# Patient Record
Sex: Female | Born: 1950
Health system: Southern US, Community
[De-identification: ages and names within clinical notes are randomized; demographics above are authoritative.]

## PROBLEM LIST (undated history)

## (undated) DIAGNOSIS — E785 Hyperlipidemia, unspecified: Secondary | ICD-10-CM

## (undated) HISTORY — DX: Hyperlipidemia, unspecified: E78.5

---

## 2001-09-13 ENCOUNTER — Other Ambulatory Visit: Admission: RE | Admit: 2001-09-13 | Discharge: 2001-09-13 | Payer: Self-pay | Admitting: Obstetrics and Gynecology

## 2002-06-04 ENCOUNTER — Encounter: Admission: RE | Admit: 2002-06-04 | Discharge: 2002-06-04 | Payer: Self-pay | Admitting: Obstetrics and Gynecology

## 2002-06-04 ENCOUNTER — Encounter: Payer: Self-pay | Admitting: Obstetrics and Gynecology

## 2002-12-25 ENCOUNTER — Other Ambulatory Visit: Admission: RE | Admit: 2002-12-25 | Discharge: 2002-12-25 | Payer: Self-pay | Admitting: Obstetrics and Gynecology

## 2004-03-17 ENCOUNTER — Other Ambulatory Visit: Admission: RE | Admit: 2004-03-17 | Discharge: 2004-03-17 | Payer: Self-pay | Admitting: Obstetrics and Gynecology

## 2004-07-23 ENCOUNTER — Ambulatory Visit (HOSPITAL_COMMUNITY): Admission: RE | Admit: 2004-07-23 | Discharge: 2004-07-23 | Payer: Self-pay | Admitting: Gastroenterology

## 2005-03-24 ENCOUNTER — Other Ambulatory Visit: Admission: RE | Admit: 2005-03-24 | Discharge: 2005-03-24 | Payer: Self-pay | Admitting: Obstetrics and Gynecology

## 2006-04-14 ENCOUNTER — Other Ambulatory Visit: Admission: RE | Admit: 2006-04-14 | Discharge: 2006-04-14 | Payer: Self-pay | Admitting: Obstetrics and Gynecology

## 2006-09-18 ENCOUNTER — Encounter: Admission: RE | Admit: 2006-09-18 | Discharge: 2006-09-18 | Payer: Self-pay | Admitting: Obstetrics and Gynecology

## 2007-05-02 ENCOUNTER — Encounter: Admission: RE | Admit: 2007-05-02 | Discharge: 2007-05-02 | Payer: Self-pay | Admitting: Obstetrics and Gynecology

## 2008-05-06 ENCOUNTER — Encounter: Admission: RE | Admit: 2008-05-06 | Discharge: 2008-05-06 | Payer: Self-pay | Admitting: Obstetrics and Gynecology

## 2009-06-03 ENCOUNTER — Encounter: Admission: RE | Admit: 2009-06-03 | Discharge: 2009-06-03 | Payer: Self-pay | Admitting: Obstetrics and Gynecology

## 2010-06-04 ENCOUNTER — Encounter: Admission: RE | Admit: 2010-06-04 | Discharge: 2010-06-04 | Payer: Self-pay | Admitting: Obstetrics and Gynecology

## 2011-03-11 NOTE — Op Note (Signed)
NAMECHASITTY, HEHL              ACCOUNT NO.:  [id number]   MEDICAL RECORD NO.:  [id number]          PATIENT TYPE:  AMB   LOCATION:  ENDO                         FACILITY:  [facility]   PHYSICIAN:  Anselmo Rod, M.D.  DATE OF BIRTH:  1950/11/28   DATE OF PROCEDURE:  07/23/2004  DATE OF DISCHARGE:                                 OPERATIVE REPORT   PROCEDURE PERFORMED:  Screening colonoscopy.   ENDOSCOPIST:  Anselmo Rod, M.D.   INSTRUMENT USED:  Olympus video colonoscope.   INDICATION FOR PROCEDURE:  A 60 year old white female undergoing screening  colonoscopy.  The patient has a history of occasional BRB.  Rule out colonic  polyps, masses, etc.   PREPROCEDURE PREPARATION:  Informed consent was procured from the patient.  The patient fasted for eight hours prior to the procedure and prepped with a  bottle of magnesium citrate and a gallon of GoLYTELY the night prior to the  procedure.   PREPROCEDURE PHYSICAL EXAMINATION:  VITAL SIGNS:  Stable.  NECK:  Supple.  CHEST:  Clear to auscultation.  HEART:  S1 and S2 regular.  ABDOMEN:  Soft with normal bowel sounds.   DESCRIPTION OF PROCEDURE:  The patient was placed in the left lateral  decubitus position.  Sedated with 75 mg of Demerol and 7.5 mg of Versed in  slow incremental doses. Once the patient was adequately sedated and  maintained on low-flow oxygen and continuous cardiac monitoring, the Olympus  video colonoscope was advanced from the rectum to the cecum.  The  appendiceal orifice and ileocecal valve were clearly visualized and  photographed.  No masses, polyps, erosions, ulcerations or diverticula were  seen.  Small internal hemorrhoids were appreciated on retroflexion in the  rectum.  The patient tolerated the procedure well without immediate  complications.   IMPRESSION:  1.  Normal colonoscopy up to the cecum, except for small nonbleeding      internal hemorrhoids.  2.  No masses, polys or diverticula seen.   RECOMMENDATIONS:  1.  Continue on a high fiber diet with liberal fluid intake.  2.  Repeat colonoscopy in the next 10 years unless the patient develops any      abnormal symptoms in the interim.  3.  Outpatient followup if needs arises in the future.       JNM/MEDQ  D:  07/23/2004  T:  07/23/2004  Job:  [id number]   cc:   Dois Davenport A. Rivard, M.D.  [address]  [place]  [place] [address]  Fax: [phone]

## 2011-05-16 ENCOUNTER — Other Ambulatory Visit: Payer: Self-pay | Admitting: Obstetrics and Gynecology

## 2011-05-16 DIAGNOSIS — Z1231 Encounter for screening mammogram for malignant neoplasm of breast: Secondary | ICD-10-CM

## 2011-06-09 ENCOUNTER — Ambulatory Visit
Admission: RE | Admit: 2011-06-09 | Discharge: 2011-06-09 | Disposition: A | Discharge status: Home / Self Care | Payer: BC Managed Care – PPO | Source: Ambulatory Visit | Attending: Obstetrics and Gynecology | Admitting: Obstetrics and Gynecology

## 2011-06-09 DIAGNOSIS — Z1231 Encounter for screening mammogram for malignant neoplasm of breast: Secondary | ICD-10-CM

## 2012-05-09 ENCOUNTER — Telehealth: Payer: Self-pay | Admitting: Obstetrics and Gynecology

## 2012-05-09 NOTE — Telephone Encounter (Signed)
LAURA/SR PT seen in hospital

## 2012-05-10 ENCOUNTER — Other Ambulatory Visit: Payer: Self-pay | Admitting: Obstetrics and Gynecology

## 2012-05-10 DIAGNOSIS — Z1231 Encounter for screening mammogram for malignant neoplasm of breast: Secondary | ICD-10-CM

## 2012-05-10 NOTE — Telephone Encounter (Signed)
Pt just curious as to if we had started doing mmg here in the office.  ld

## 2012-05-10 NOTE — Telephone Encounter (Signed)
LMTC @ 8:50  ld

## 2012-07-03 ENCOUNTER — Ambulatory Visit: Payer: BC Managed Care – PPO

## 2012-07-04 ENCOUNTER — Ambulatory Visit
Admission: RE | Admit: 2012-07-04 | Discharge: 2012-07-04 | Disposition: A | Discharge status: Home / Self Care | Payer: BC Managed Care – PPO | Source: Ambulatory Visit | Attending: Obstetrics and Gynecology | Admitting: Obstetrics and Gynecology

## 2012-07-04 DIAGNOSIS — Z1231 Encounter for screening mammogram for malignant neoplasm of breast: Secondary | ICD-10-CM

## 2012-07-05 ENCOUNTER — Encounter: Payer: Self-pay | Admitting: Obstetrics and Gynecology

## 2012-07-05 NOTE — Progress Notes (Signed)
Quick Note:  Please send "Dense breast" letter to patient and document in chart when letter is sent. ______ 

## 2012-08-29 ENCOUNTER — Ambulatory Visit: Payer: Self-pay | Admitting: Obstetrics and Gynecology

## 2012-08-30 ENCOUNTER — Ambulatory Visit: Payer: Self-pay | Admitting: Obstetrics and Gynecology

## 2013-08-28 ENCOUNTER — Other Ambulatory Visit: Payer: Self-pay

## 2013-08-28 DIAGNOSIS — Z1231 Encounter for screening mammogram for malignant neoplasm of breast: Secondary | ICD-10-CM

## 2013-10-02 ENCOUNTER — Ambulatory Visit: Payer: BC Managed Care – PPO

## 2013-11-19 ENCOUNTER — Ambulatory Visit: Payer: BC Managed Care – PPO

## 2013-11-20 ENCOUNTER — Ambulatory Visit
Admission: RE | Admit: 2013-11-20 | Discharge: 2013-11-20 | Disposition: A | Discharge status: Home / Self Care | Payer: BC Managed Care – PPO | Source: Ambulatory Visit

## 2013-11-20 ENCOUNTER — Ambulatory Visit: Payer: BC Managed Care – PPO

## 2013-11-20 DIAGNOSIS — Z1231 Encounter for screening mammogram for malignant neoplasm of breast: Secondary | ICD-10-CM

## 2014-11-27 ENCOUNTER — Other Ambulatory Visit: Payer: Self-pay

## 2014-11-27 DIAGNOSIS — Z1231 Encounter for screening mammogram for malignant neoplasm of breast: Secondary | ICD-10-CM

## 2014-12-18 ENCOUNTER — Ambulatory Visit
Admission: RE | Admit: 2014-12-18 | Discharge: 2014-12-18 | Disposition: A | Discharge status: Home / Self Care | Payer: BLUE CROSS/BLUE SHIELD | Source: Ambulatory Visit

## 2014-12-18 DIAGNOSIS — Z1231 Encounter for screening mammogram for malignant neoplasm of breast: Secondary | ICD-10-CM

## 2015-07-08 ENCOUNTER — Other Ambulatory Visit: Payer: Self-pay | Admitting: *Deleted

## 2015-11-16 ENCOUNTER — Other Ambulatory Visit: Payer: Self-pay

## 2015-11-16 DIAGNOSIS — Z1231 Encounter for screening mammogram for malignant neoplasm of breast: Secondary | ICD-10-CM

## 2015-12-25 ENCOUNTER — Ambulatory Visit: Payer: BLUE CROSS/BLUE SHIELD

## 2016-01-04 ENCOUNTER — Ambulatory Visit
Admission: RE | Admit: 2016-01-04 | Discharge: 2016-01-04 | Disposition: A | Discharge status: Home / Self Care | Payer: BLUE CROSS/BLUE SHIELD | Source: Ambulatory Visit

## 2016-01-04 DIAGNOSIS — Z1231 Encounter for screening mammogram for malignant neoplasm of breast: Secondary | ICD-10-CM

## 2016-07-14 DIAGNOSIS — H25013 Cortical age-related cataract, bilateral: Secondary | ICD-10-CM | POA: Diagnosis not present

## 2016-07-14 DIAGNOSIS — H40013 Open angle with borderline findings, low risk, bilateral: Secondary | ICD-10-CM | POA: Diagnosis not present

## 2016-07-14 DIAGNOSIS — H2513 Age-related nuclear cataract, bilateral: Secondary | ICD-10-CM | POA: Diagnosis not present

## 2016-07-14 DIAGNOSIS — H25042 Posterior subcapsular polar age-related cataract, left eye: Secondary | ICD-10-CM | POA: Diagnosis not present

## 2016-09-07 DIAGNOSIS — F4323 Adjustment disorder with mixed anxiety and depressed mood: Secondary | ICD-10-CM | POA: Diagnosis not present

## 2016-12-01 ENCOUNTER — Other Ambulatory Visit: Payer: Self-pay | Admitting: Obstetrics and Gynecology

## 2016-12-01 DIAGNOSIS — Z1231 Encounter for screening mammogram for malignant neoplasm of breast: Secondary | ICD-10-CM

## 2017-01-05 DIAGNOSIS — L821 Other seborrheic keratosis: Secondary | ICD-10-CM | POA: Diagnosis not present

## 2017-01-05 DIAGNOSIS — D225 Melanocytic nevi of trunk: Secondary | ICD-10-CM | POA: Diagnosis not present

## 2017-01-16 ENCOUNTER — Ambulatory Visit
Admission: RE | Admit: 2017-01-16 | Discharge: 2017-01-16 | Disposition: A | Discharge status: Home / Self Care | Payer: Medicare Other | Source: Ambulatory Visit | Attending: Obstetrics and Gynecology | Admitting: Obstetrics and Gynecology

## 2017-01-16 DIAGNOSIS — Z1231 Encounter for screening mammogram for malignant neoplasm of breast: Secondary | ICD-10-CM

## 2017-01-31 DIAGNOSIS — H903 Sensorineural hearing loss, bilateral: Secondary | ICD-10-CM | POA: Diagnosis not present

## 2017-02-02 DIAGNOSIS — F4323 Adjustment disorder with mixed anxiety and depressed mood: Secondary | ICD-10-CM | POA: Diagnosis not present

## 2017-03-03 DIAGNOSIS — F4323 Adjustment disorder with mixed anxiety and depressed mood: Secondary | ICD-10-CM | POA: Diagnosis not present

## 2017-05-17 DIAGNOSIS — Z1231 Encounter for screening mammogram for malignant neoplasm of breast: Secondary | ICD-10-CM | POA: Diagnosis not present

## 2017-05-17 DIAGNOSIS — M810 Age-related osteoporosis without current pathological fracture: Secondary | ICD-10-CM | POA: Diagnosis not present

## 2017-05-17 DIAGNOSIS — Z1211 Encounter for screening for malignant neoplasm of colon: Secondary | ICD-10-CM | POA: Diagnosis not present

## 2017-05-17 DIAGNOSIS — Z01419 Encounter for gynecological examination (general) (routine) without abnormal findings: Secondary | ICD-10-CM | POA: Diagnosis not present

## 2017-05-17 DIAGNOSIS — Z6820 Body mass index (BMI) 20.0-20.9, adult: Secondary | ICD-10-CM | POA: Diagnosis not present

## 2017-05-19 DIAGNOSIS — F4323 Adjustment disorder with mixed anxiety and depressed mood: Secondary | ICD-10-CM | POA: Diagnosis not present

## 2017-07-26 DIAGNOSIS — F4323 Adjustment disorder with mixed anxiety and depressed mood: Secondary | ICD-10-CM | POA: Diagnosis not present

## 2017-08-08 DIAGNOSIS — E559 Vitamin D deficiency, unspecified: Secondary | ICD-10-CM | POA: Diagnosis not present

## 2017-08-08 DIAGNOSIS — M810 Age-related osteoporosis without current pathological fracture: Secondary | ICD-10-CM | POA: Diagnosis not present

## 2017-08-08 DIAGNOSIS — Z0000 Encounter for general adult medical examination without abnormal findings: Secondary | ICD-10-CM | POA: Diagnosis not present

## 2017-08-08 DIAGNOSIS — Z801 Family history of malignant neoplasm of trachea, bronchus and lung: Secondary | ICD-10-CM | POA: Diagnosis not present

## 2017-08-08 DIAGNOSIS — Z1389 Encounter for screening for other disorder: Secondary | ICD-10-CM | POA: Diagnosis not present

## 2017-08-08 DIAGNOSIS — E7800 Pure hypercholesterolemia, unspecified: Secondary | ICD-10-CM | POA: Diagnosis not present

## 2017-08-17 DIAGNOSIS — F4323 Adjustment disorder with mixed anxiety and depressed mood: Secondary | ICD-10-CM | POA: Diagnosis not present

## 2017-10-06 DIAGNOSIS — E7800 Pure hypercholesterolemia, unspecified: Secondary | ICD-10-CM | POA: Diagnosis not present

## 2017-10-06 DIAGNOSIS — R740 Nonspecific elevation of levels of transaminase and lactic acid dehydrogenase [LDH]: Secondary | ICD-10-CM | POA: Diagnosis not present

## 2017-10-06 DIAGNOSIS — R350 Frequency of micturition: Secondary | ICD-10-CM | POA: Diagnosis not present

## 2017-12-11 ENCOUNTER — Other Ambulatory Visit: Payer: Self-pay | Admitting: Obstetrics and Gynecology

## 2017-12-11 DIAGNOSIS — Z1231 Encounter for screening mammogram for malignant neoplasm of breast: Secondary | ICD-10-CM

## 2017-12-27 DIAGNOSIS — H35361 Drusen (degenerative) of macula, right eye: Secondary | ICD-10-CM | POA: Diagnosis not present

## 2017-12-27 DIAGNOSIS — H43393 Other vitreous opacities, bilateral: Secondary | ICD-10-CM | POA: Diagnosis not present

## 2017-12-27 DIAGNOSIS — H40013 Open angle with borderline findings, low risk, bilateral: Secondary | ICD-10-CM | POA: Diagnosis not present

## 2017-12-27 DIAGNOSIS — H2513 Age-related nuclear cataract, bilateral: Secondary | ICD-10-CM | POA: Diagnosis not present

## 2018-01-18 ENCOUNTER — Ambulatory Visit: Payer: Medicare Other

## 2018-02-02 DIAGNOSIS — B351 Tinea unguium: Secondary | ICD-10-CM | POA: Diagnosis not present

## 2018-02-06 ENCOUNTER — Ambulatory Visit
Admission: RE | Admit: 2018-02-06 | Discharge: 2018-02-06 | Disposition: A | Discharge status: Home / Self Care | Payer: Medicare Other | Source: Ambulatory Visit | Attending: Obstetrics and Gynecology | Admitting: Obstetrics and Gynecology

## 2018-02-06 DIAGNOSIS — Z1231 Encounter for screening mammogram for malignant neoplasm of breast: Secondary | ICD-10-CM

## 2018-02-13 DIAGNOSIS — M2042 Other hammer toe(s) (acquired), left foot: Secondary | ICD-10-CM | POA: Diagnosis not present

## 2018-02-13 DIAGNOSIS — L602 Onychogryphosis: Secondary | ICD-10-CM | POA: Diagnosis not present

## 2018-02-13 DIAGNOSIS — M2041 Other hammer toe(s) (acquired), right foot: Secondary | ICD-10-CM | POA: Diagnosis not present

## 2018-02-13 DIAGNOSIS — D2371 Other benign neoplasm of skin of right lower limb, including hip: Secondary | ICD-10-CM | POA: Diagnosis not present

## 2018-02-13 DIAGNOSIS — L603 Nail dystrophy: Secondary | ICD-10-CM | POA: Diagnosis not present

## 2018-02-19 DIAGNOSIS — L603 Nail dystrophy: Secondary | ICD-10-CM | POA: Diagnosis not present

## 2018-02-28 ENCOUNTER — Ambulatory Visit: Payer: Medicare Other

## 2018-03-08 DIAGNOSIS — M2041 Other hammer toe(s) (acquired), right foot: Secondary | ICD-10-CM | POA: Diagnosis not present

## 2018-03-08 DIAGNOSIS — L602 Onychogryphosis: Secondary | ICD-10-CM | POA: Diagnosis not present

## 2018-03-08 DIAGNOSIS — M2042 Other hammer toe(s) (acquired), left foot: Secondary | ICD-10-CM | POA: Diagnosis not present

## 2018-03-08 DIAGNOSIS — D2371 Other benign neoplasm of skin of right lower limb, including hip: Secondary | ICD-10-CM | POA: Diagnosis not present

## 2018-03-27 DIAGNOSIS — D2371 Other benign neoplasm of skin of right lower limb, including hip: Secondary | ICD-10-CM | POA: Diagnosis not present

## 2018-04-11 DIAGNOSIS — D2371 Other benign neoplasm of skin of right lower limb, including hip: Secondary | ICD-10-CM | POA: Diagnosis not present

## 2018-05-14 DIAGNOSIS — D2371 Other benign neoplasm of skin of right lower limb, including hip: Secondary | ICD-10-CM | POA: Diagnosis not present

## 2018-06-14 DIAGNOSIS — M810 Age-related osteoporosis without current pathological fracture: Secondary | ICD-10-CM | POA: Diagnosis not present

## 2018-06-14 DIAGNOSIS — Z6820 Body mass index (BMI) 20.0-20.9, adult: Secondary | ICD-10-CM | POA: Diagnosis not present

## 2018-06-14 DIAGNOSIS — Z1231 Encounter for screening mammogram for malignant neoplasm of breast: Secondary | ICD-10-CM | POA: Diagnosis not present

## 2018-06-14 DIAGNOSIS — Z1211 Encounter for screening for malignant neoplasm of colon: Secondary | ICD-10-CM | POA: Diagnosis not present

## 2018-08-07 DIAGNOSIS — Z23 Encounter for immunization: Secondary | ICD-10-CM | POA: Diagnosis not present

## 2018-08-16 DIAGNOSIS — L298 Other pruritus: Secondary | ICD-10-CM | POA: Diagnosis not present

## 2018-08-21 DIAGNOSIS — M810 Age-related osteoporosis without current pathological fracture: Secondary | ICD-10-CM | POA: Diagnosis not present

## 2018-08-21 DIAGNOSIS — E7800 Pure hypercholesterolemia, unspecified: Secondary | ICD-10-CM | POA: Diagnosis not present

## 2018-08-21 DIAGNOSIS — Z0000 Encounter for general adult medical examination without abnormal findings: Secondary | ICD-10-CM | POA: Diagnosis not present

## 2018-08-21 DIAGNOSIS — D229 Melanocytic nevi, unspecified: Secondary | ICD-10-CM | POA: Diagnosis not present

## 2018-08-21 DIAGNOSIS — E559 Vitamin D deficiency, unspecified: Secondary | ICD-10-CM | POA: Diagnosis not present

## 2018-08-21 DIAGNOSIS — L821 Other seborrheic keratosis: Secondary | ICD-10-CM | POA: Diagnosis not present

## 2018-08-21 DIAGNOSIS — Z1389 Encounter for screening for other disorder: Secondary | ICD-10-CM | POA: Diagnosis not present

## 2018-10-31 DIAGNOSIS — M25561 Pain in right knee: Secondary | ICD-10-CM | POA: Diagnosis not present

## 2018-12-07 DIAGNOSIS — D2261 Melanocytic nevi of right upper limb, including shoulder: Secondary | ICD-10-CM | POA: Diagnosis not present

## 2018-12-07 DIAGNOSIS — D225 Melanocytic nevi of trunk: Secondary | ICD-10-CM | POA: Diagnosis not present

## 2018-12-07 DIAGNOSIS — L821 Other seborrheic keratosis: Secondary | ICD-10-CM | POA: Diagnosis not present

## 2018-12-07 DIAGNOSIS — D2271 Melanocytic nevi of right lower limb, including hip: Secondary | ICD-10-CM | POA: Diagnosis not present

## 2018-12-07 DIAGNOSIS — D1801 Hemangioma of skin and subcutaneous tissue: Secondary | ICD-10-CM | POA: Diagnosis not present

## 2019-03-25 DIAGNOSIS — Z1231 Encounter for screening mammogram for malignant neoplasm of breast: Secondary | ICD-10-CM | POA: Diagnosis not present

## 2019-04-08 DIAGNOSIS — H11052 Peripheral pterygium, progressive, left eye: Secondary | ICD-10-CM | POA: Diagnosis not present

## 2019-04-08 DIAGNOSIS — H00025 Hordeolum internum left lower eyelid: Secondary | ICD-10-CM | POA: Diagnosis not present

## 2019-04-15 DIAGNOSIS — R928 Other abnormal and inconclusive findings on diagnostic imaging of breast: Secondary | ICD-10-CM | POA: Diagnosis not present

## 2019-04-18 DIAGNOSIS — Z20828 Contact with and (suspected) exposure to other viral communicable diseases: Secondary | ICD-10-CM | POA: Diagnosis not present

## 2019-05-21 DIAGNOSIS — H25013 Cortical age-related cataract, bilateral: Secondary | ICD-10-CM | POA: Diagnosis not present

## 2019-05-21 DIAGNOSIS — H35363 Drusen (degenerative) of macula, bilateral: Secondary | ICD-10-CM | POA: Diagnosis not present

## 2019-05-21 DIAGNOSIS — H40013 Open angle with borderline findings, low risk, bilateral: Secondary | ICD-10-CM | POA: Diagnosis not present

## 2019-05-21 DIAGNOSIS — H2513 Age-related nuclear cataract, bilateral: Secondary | ICD-10-CM | POA: Diagnosis not present

## 2019-06-19 DIAGNOSIS — Z01419 Encounter for gynecological examination (general) (routine) without abnormal findings: Secondary | ICD-10-CM | POA: Diagnosis not present

## 2019-06-19 DIAGNOSIS — Z1211 Encounter for screening for malignant neoplasm of colon: Secondary | ICD-10-CM | POA: Diagnosis not present

## 2019-06-19 DIAGNOSIS — M810 Age-related osteoporosis without current pathological fracture: Secondary | ICD-10-CM | POA: Diagnosis not present

## 2019-06-19 DIAGNOSIS — Z6821 Body mass index (BMI) 21.0-21.9, adult: Secondary | ICD-10-CM | POA: Diagnosis not present

## 2019-06-19 DIAGNOSIS — Z1231 Encounter for screening mammogram for malignant neoplasm of breast: Secondary | ICD-10-CM | POA: Diagnosis not present

## 2019-06-19 DIAGNOSIS — E559 Vitamin D deficiency, unspecified: Secondary | ICD-10-CM | POA: Diagnosis not present

## 2019-06-19 DIAGNOSIS — Z124 Encounter for screening for malignant neoplasm of cervix: Secondary | ICD-10-CM | POA: Diagnosis not present

## 2019-06-24 DIAGNOSIS — H04123 Dry eye syndrome of bilateral lacrimal glands: Secondary | ICD-10-CM | POA: Diagnosis not present

## 2019-06-24 DIAGNOSIS — H40013 Open angle with borderline findings, low risk, bilateral: Secondary | ICD-10-CM | POA: Diagnosis not present

## 2019-06-28 DIAGNOSIS — Z23 Encounter for immunization: Secondary | ICD-10-CM | POA: Diagnosis not present

## 2019-10-02 ENCOUNTER — Other Ambulatory Visit: Payer: Self-pay

## 2019-10-02 DIAGNOSIS — Z20822 Contact with and (suspected) exposure to covid-19: Secondary | ICD-10-CM

## 2019-10-02 DIAGNOSIS — Z20828 Contact with and (suspected) exposure to other viral communicable diseases: Secondary | ICD-10-CM | POA: Diagnosis not present

## 2019-10-03 LAB — NOVEL CORONAVIRUS, NAA: SARS-CoV-2, NAA: NOT DETECTED

## 2019-11-07 DIAGNOSIS — Z20828 Contact with and (suspected) exposure to other viral communicable diseases: Secondary | ICD-10-CM | POA: Diagnosis not present

## 2019-12-03 ENCOUNTER — Ambulatory Visit: Payer: Medicare Other

## 2019-12-05 ENCOUNTER — Other Ambulatory Visit: Payer: Self-pay

## 2019-12-05 ENCOUNTER — Ambulatory Visit: Payer: Medicare Other

## 2019-12-05 ENCOUNTER — Ambulatory Visit: Payer: Medicare Other | Attending: Internal Medicine

## 2019-12-05 DIAGNOSIS — Z23 Encounter for immunization: Secondary | ICD-10-CM | POA: Insufficient documentation

## 2019-12-05 NOTE — Progress Notes (Signed)
   Covid-19 Vaccination Clinic  Name:  Kym Siddons    MRN: [id number] DOB: September 16, 1951  12/05/2019  Ms. Klunder was observed post Covid-19 immunization for 15 minutes without incidence. She was provided with Vaccine Information Sheet and instruction to access the V-Safe system.   Ms. Calahan was instructed to call 911 with any severe reactions post vaccine: Marland Kitchen Difficulty breathing  . Swelling of your face and throat  . A fast heartbeat  . A bad rash all over your body  . Dizziness and weakness    Immunizations Administered    Name Date Dose VIS Date Route   Pfizer COVID-19 Vaccine 12/05/2019  9:46 AM 0.3 mL 10/04/2019 Intramuscular   Manufacturer: [place]   Lot: [id number]   [employer]: [id number]

## 2019-12-17 ENCOUNTER — Ambulatory Visit: Payer: Medicare Other

## 2019-12-28 ENCOUNTER — Ambulatory Visit: Payer: Medicare Other

## 2019-12-29 DIAGNOSIS — Z20828 Contact with and (suspected) exposure to other viral communicable diseases: Secondary | ICD-10-CM | POA: Diagnosis not present

## 2019-12-30 ENCOUNTER — Ambulatory Visit: Payer: Medicare Other | Attending: Internal Medicine

## 2019-12-30 DIAGNOSIS — Z23 Encounter for immunization: Secondary | ICD-10-CM | POA: Insufficient documentation

## 2019-12-30 NOTE — Progress Notes (Signed)
   Covid-19 Vaccination Clinic  Name:  Tina Collins    MRN: [id number] DOB: 1951-02-27  12/30/2019  [name] was observed post Covid-19 immunization for 15 minutes without incident. She was provided with Vaccine Information Sheet and instruction to access the V-Safe system.   [name] was instructed to call 911 with any severe reactions post vaccine: Marland Kitchen Difficulty breathing  . Swelling of face and throat  . A fast heartbeat  . A bad rash all over body  . Dizziness and weakness   Immunizations Administered    Name Date Dose VIS Date Route   Pfizer COVID-19 Vaccine 12/30/2019  9:45 AM 0.3 mL 10/04/2019 Intramuscular   Manufacturer: [place]   Lot: [id number]   [place]: [id number]

## 2020-01-03 DIAGNOSIS — D485 Neoplasm of uncertain behavior of skin: Secondary | ICD-10-CM | POA: Diagnosis not present

## 2020-01-03 DIAGNOSIS — L814 Other melanin hyperpigmentation: Secondary | ICD-10-CM | POA: Diagnosis not present

## 2020-01-20 DIAGNOSIS — M7752 Other enthesopathy of left foot: Secondary | ICD-10-CM | POA: Diagnosis not present

## 2020-01-20 DIAGNOSIS — M21622 Bunionette of left foot: Secondary | ICD-10-CM | POA: Diagnosis not present

## 2020-02-26 DIAGNOSIS — Z6820 Body mass index (BMI) 20.0-20.9, adult: Secondary | ICD-10-CM | POA: Diagnosis not present

## 2020-02-26 DIAGNOSIS — M810 Age-related osteoporosis without current pathological fracture: Secondary | ICD-10-CM | POA: Diagnosis not present

## 2020-02-26 DIAGNOSIS — E559 Vitamin D deficiency, unspecified: Secondary | ICD-10-CM | POA: Diagnosis not present

## 2020-02-27 DIAGNOSIS — Z03818 Encounter for observation for suspected exposure to other biological agents ruled out: Secondary | ICD-10-CM | POA: Diagnosis not present

## 2020-02-27 DIAGNOSIS — Z20828 Contact with and (suspected) exposure to other viral communicable diseases: Secondary | ICD-10-CM | POA: Diagnosis not present

## 2020-03-04 DIAGNOSIS — Z872 Personal history of diseases of the skin and subcutaneous tissue: Secondary | ICD-10-CM | POA: Diagnosis not present

## 2020-03-04 DIAGNOSIS — D485 Neoplasm of uncertain behavior of skin: Secondary | ICD-10-CM | POA: Diagnosis not present

## 2020-03-05 DIAGNOSIS — E559 Vitamin D deficiency, unspecified: Secondary | ICD-10-CM | POA: Diagnosis not present

## 2020-03-05 DIAGNOSIS — Z79899 Other long term (current) drug therapy: Secondary | ICD-10-CM | POA: Diagnosis not present

## 2020-03-12 ENCOUNTER — Other Ambulatory Visit: Payer: Self-pay | Admitting: Family Medicine

## 2020-03-12 DIAGNOSIS — E7800 Pure hypercholesterolemia, unspecified: Secondary | ICD-10-CM

## 2020-03-19 DIAGNOSIS — Z20828 Contact with and (suspected) exposure to other viral communicable diseases: Secondary | ICD-10-CM | POA: Diagnosis not present

## 2020-03-19 DIAGNOSIS — Z03818 Encounter for observation for suspected exposure to other biological agents ruled out: Secondary | ICD-10-CM | POA: Diagnosis not present

## 2020-04-21 ENCOUNTER — Ambulatory Visit
Admission: RE | Admit: 2020-04-21 | Discharge: 2020-04-21 | Disposition: A | Discharge status: Home / Self Care | Payer: Self-pay | Source: Ambulatory Visit | Attending: Family Medicine | Admitting: Family Medicine

## 2020-04-21 DIAGNOSIS — E7800 Pure hypercholesterolemia, unspecified: Secondary | ICD-10-CM

## 2020-05-11 DIAGNOSIS — M25562 Pain in left knee: Secondary | ICD-10-CM | POA: Diagnosis not present

## 2020-05-27 DIAGNOSIS — H6123 Impacted cerumen, bilateral: Secondary | ICD-10-CM | POA: Diagnosis not present

## 2020-05-27 DIAGNOSIS — H903 Sensorineural hearing loss, bilateral: Secondary | ICD-10-CM | POA: Diagnosis not present

## 2020-05-29 DIAGNOSIS — M25562 Pain in left knee: Secondary | ICD-10-CM | POA: Diagnosis not present

## 2020-06-18 DIAGNOSIS — E559 Vitamin D deficiency, unspecified: Secondary | ICD-10-CM | POA: Diagnosis not present

## 2020-06-18 DIAGNOSIS — Z6821 Body mass index (BMI) 21.0-21.9, adult: Secondary | ICD-10-CM | POA: Diagnosis not present

## 2020-06-18 DIAGNOSIS — Z01419 Encounter for gynecological examination (general) (routine) without abnormal findings: Secondary | ICD-10-CM | POA: Diagnosis not present

## 2020-06-18 DIAGNOSIS — M810 Age-related osteoporosis without current pathological fracture: Secondary | ICD-10-CM | POA: Diagnosis not present

## 2020-06-18 DIAGNOSIS — Z1211 Encounter for screening for malignant neoplasm of colon: Secondary | ICD-10-CM | POA: Diagnosis not present

## 2020-06-18 DIAGNOSIS — Z1231 Encounter for screening mammogram for malignant neoplasm of breast: Secondary | ICD-10-CM | POA: Diagnosis not present

## 2020-07-16 DIAGNOSIS — E559 Vitamin D deficiency, unspecified: Secondary | ICD-10-CM | POA: Diagnosis not present

## 2020-07-16 DIAGNOSIS — Z23 Encounter for immunization: Secondary | ICD-10-CM | POA: Diagnosis not present

## 2020-07-16 DIAGNOSIS — R928 Other abnormal and inconclusive findings on diagnostic imaging of breast: Secondary | ICD-10-CM | POA: Diagnosis not present

## 2020-07-21 IMAGING — CT CT CARDIAC CORONARY ARTERY CALCIUM SCORE
3 series · 14 of 20 positions shown, 16 images · non-contrast
Comparison: No priors.

CLINICAL DATA: 68-year-old Caucasian female with elevated
cholesterol levels. Family history of heart disease.

EXAM:
CT CARDIAC CORONARY ARTERY CALCIUM SCORE
TECHNIQUE: Non-contrast imaging through the heart was performed using
prospective ECG gating. Image post processing was performed on an
independent workstation, allowing for quantitative analysis of the
heart and coronary arteries. Note that this exam targets the heart
and the chest was not imaged in its entirety.

[Series 2: calcium scoring 2.00 qr36 bestdiast 70% hrt calciu · axial · 0.28mm/px · z∈[+1661,+1745]mm · 4 of 70 slices shown]
[im 14/70  vessel]
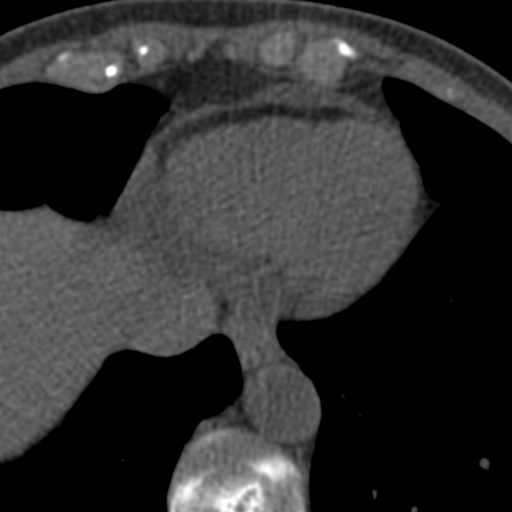
[im 28/70  vessel]
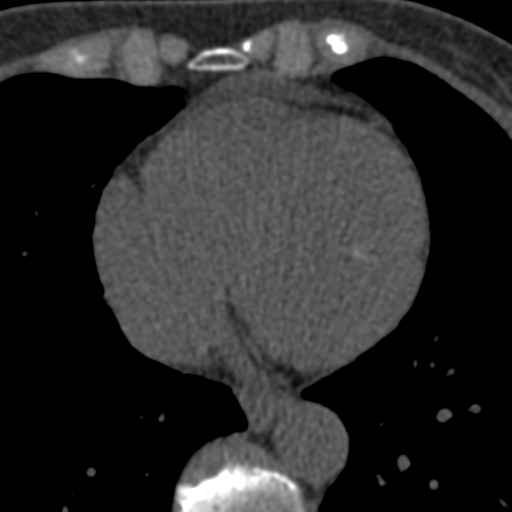
[im 42/70  vessel]
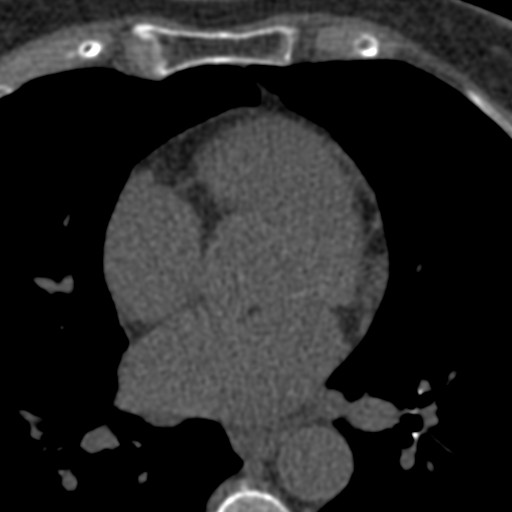
[im 56/70  vessel]
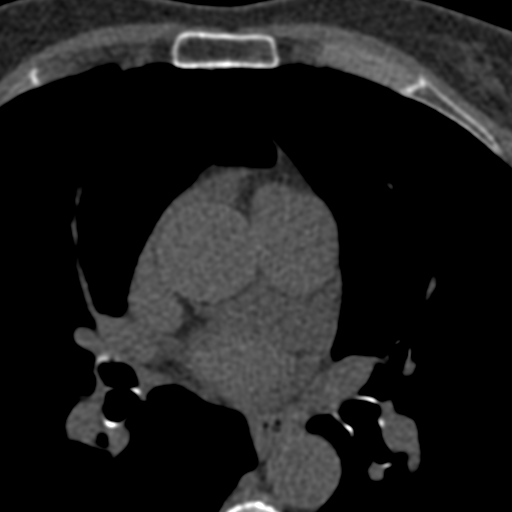

[Series 3: calcium scoring 2.00 br40 bestdiast 70% axial · axial · 0.49mm/px · z∈[+1657,+1749]mm · 5 of 70 slices shown, 7 images]
[im 12/70  vessel]
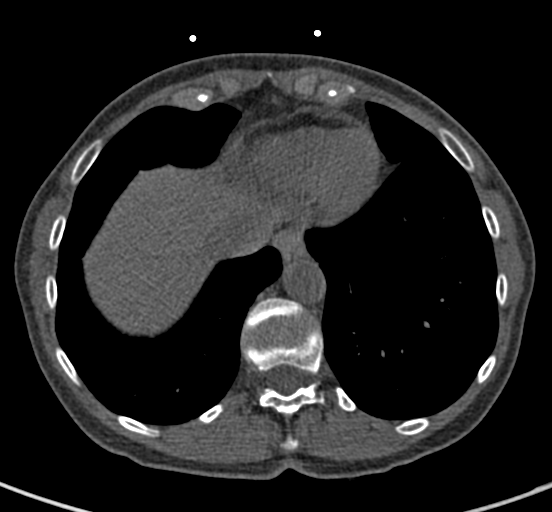
[im 12/70  lung]
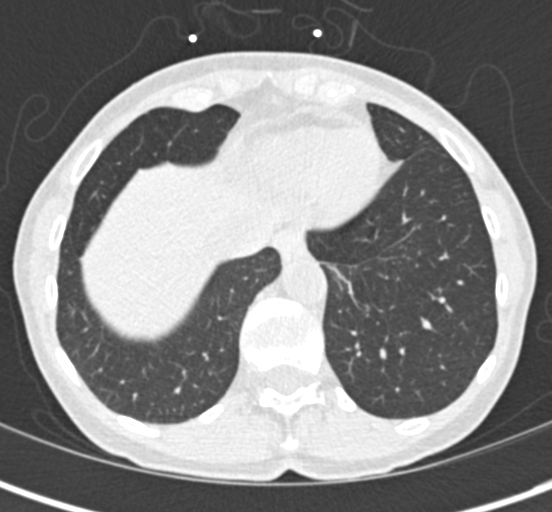
[im 24/70  vessel]
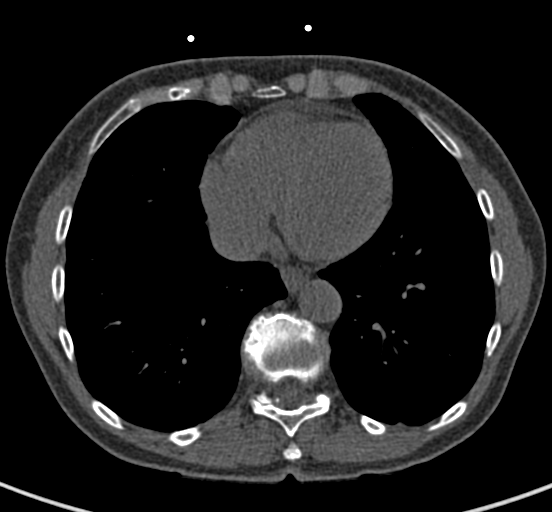
[im 35/70  vessel]
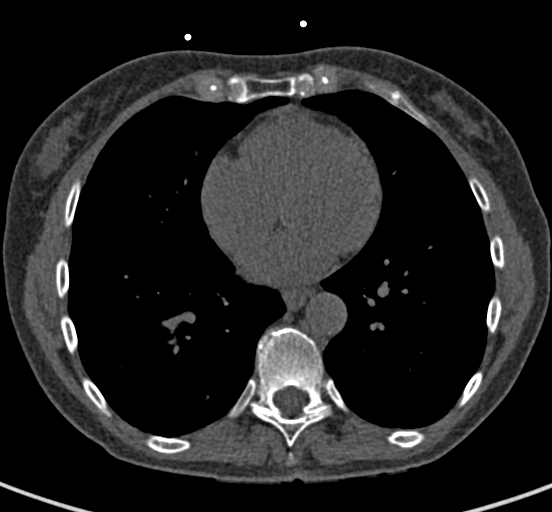
[im 47/70  vessel]
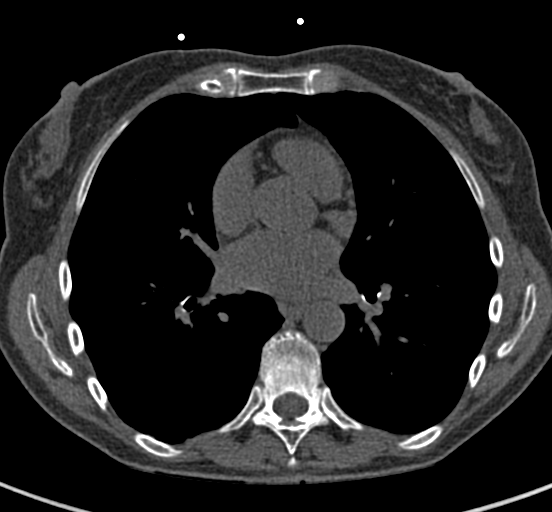
[im 58/70  vessel]
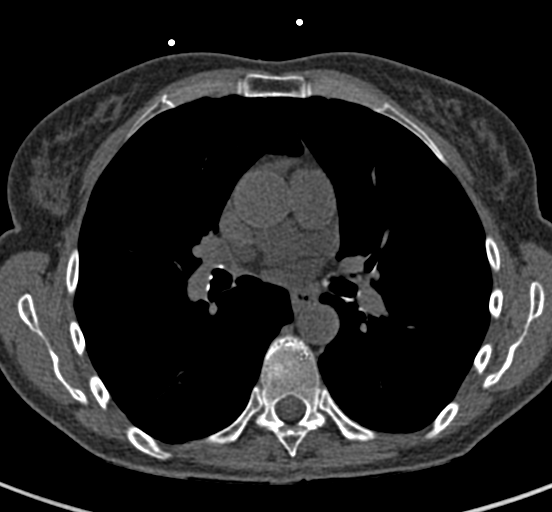
[im 58/70  lung]
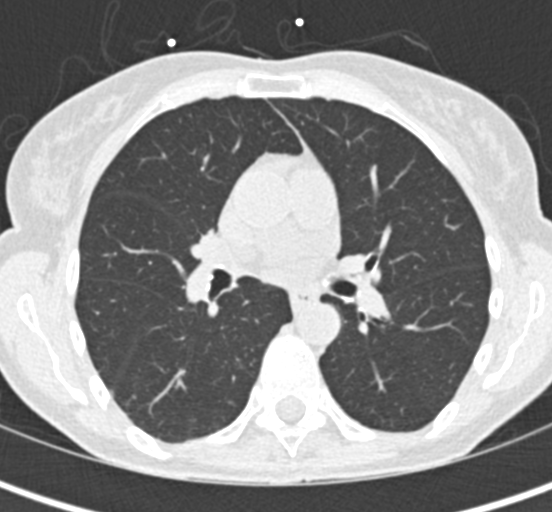

[Series 9: calcium scoring 2.00 br60 bestdiast 70% lungs · axial · 0.49mm/px · z∈[+1657,+1749]mm · 5 of 70 slices shown]
[im 12/70  vessel]
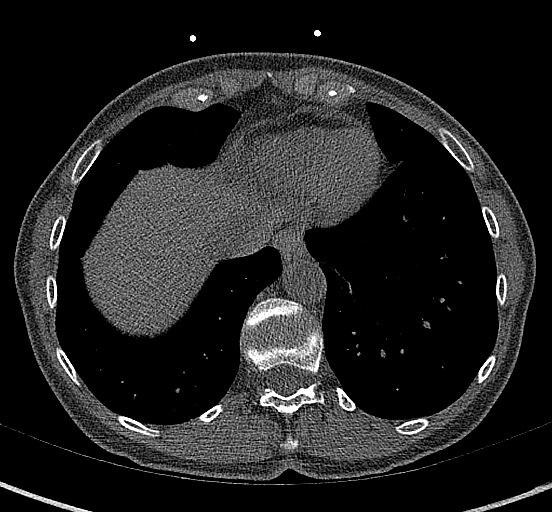
[im 24/70  vessel]
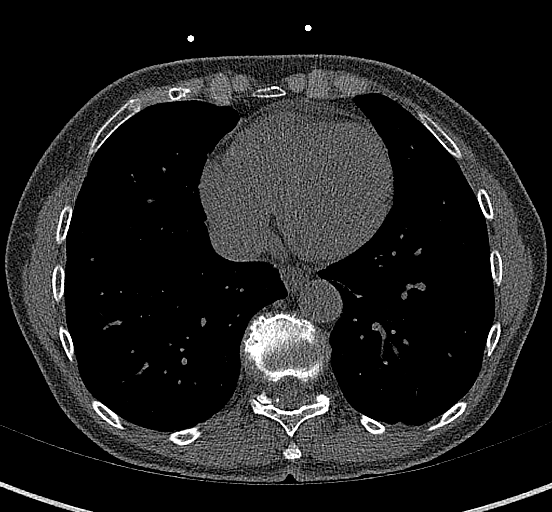
[im 35/70  vessel]
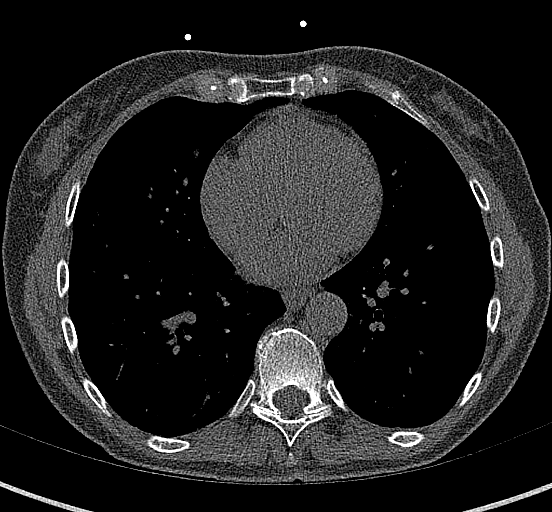
[im 47/70  vessel]
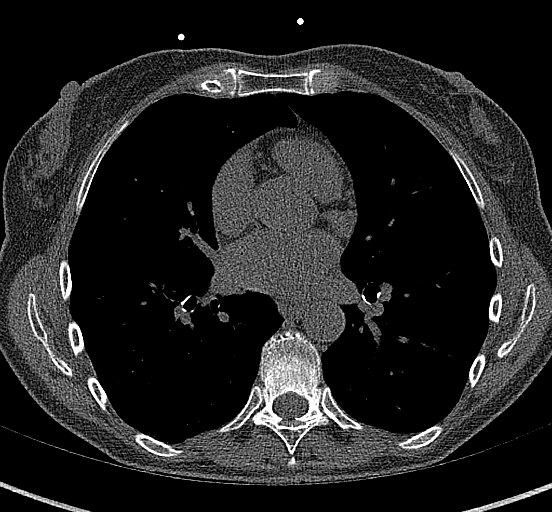
[im 58/70  vessel]
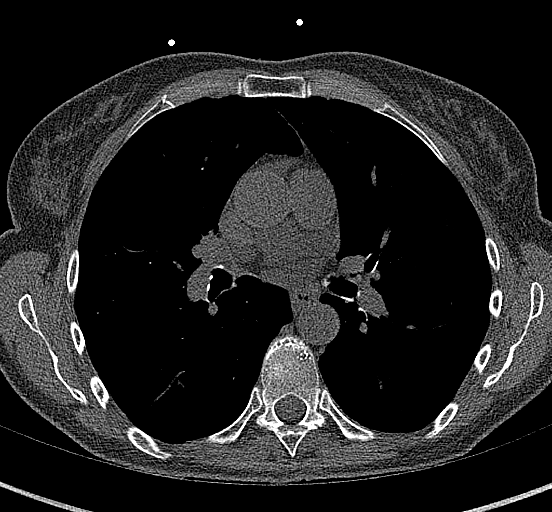

[14 of 20 positions shown; findings below may reference images not displayed]

FINDINGS: CORONARY CALCIUM SCORES:

Left Main: 0

LAD: 0

LCx: 0

RCA: 0

Total Agatston Score: 0

[HOSPITAL] percentile: N/A

AORTA MEASUREMENTS:

Ascending Aorta: 28 mm

Descending Aorta: 20 mm

OTHER FINDINGS:

Within the visualized portions of the thorax there are no suspicious
appearing pulmonary nodules or masses, there is no acute
consolidative airspace disease, no pleural effusions, no
pneumothorax and no lymphadenopathy. Visualized portions of the
upper abdomen are unremarkable. There are no aggressive appearing
lytic or blastic lesions noted in the visualized portions of the
skeleton.
IMPRESSION: 1. Patient's total coronary artery calcium score is 0 which
indicates a very low (but nonzero) risk of major adverse
cardiovascular events over the next 10 years.
2. No significant incidental noncardiac findings are noted.

## 2020-07-31 DIAGNOSIS — Z23 Encounter for immunization: Secondary | ICD-10-CM | POA: Diagnosis not present

## 2020-09-30 DIAGNOSIS — Z20822 Contact with and (suspected) exposure to covid-19: Secondary | ICD-10-CM | POA: Diagnosis not present

## 2021-03-11 DIAGNOSIS — S46211A Strain of muscle, fascia and tendon of other parts of biceps, right arm, initial encounter: Secondary | ICD-10-CM | POA: Diagnosis not present

## 2021-03-11 DIAGNOSIS — R32 Unspecified urinary incontinence: Secondary | ICD-10-CM | POA: Diagnosis not present

## 2021-03-11 DIAGNOSIS — Z1211 Encounter for screening for malignant neoplasm of colon: Secondary | ICD-10-CM | POA: Diagnosis not present

## 2021-03-11 DIAGNOSIS — Z79899 Other long term (current) drug therapy: Secondary | ICD-10-CM | POA: Diagnosis not present

## 2021-03-11 DIAGNOSIS — H9113 Presbycusis, bilateral: Secondary | ICD-10-CM | POA: Diagnosis not present

## 2021-03-11 DIAGNOSIS — E7800 Pure hypercholesterolemia, unspecified: Secondary | ICD-10-CM | POA: Diagnosis not present

## 2021-03-11 DIAGNOSIS — R140 Abdominal distension (gaseous): Secondary | ICD-10-CM | POA: Diagnosis not present

## 2021-03-11 DIAGNOSIS — M810 Age-related osteoporosis without current pathological fracture: Secondary | ICD-10-CM | POA: Diagnosis not present

## 2021-03-11 DIAGNOSIS — Z6820 Body mass index (BMI) 20.0-20.9, adult: Secondary | ICD-10-CM | POA: Diagnosis not present

## 2021-03-11 DIAGNOSIS — K5900 Constipation, unspecified: Secondary | ICD-10-CM | POA: Diagnosis not present

## 2021-03-11 DIAGNOSIS — Z0000 Encounter for general adult medical examination without abnormal findings: Secondary | ICD-10-CM | POA: Diagnosis not present

## 2021-03-11 DIAGNOSIS — E559 Vitamin D deficiency, unspecified: Secondary | ICD-10-CM | POA: Diagnosis not present

## 2021-03-13 DIAGNOSIS — Z23 Encounter for immunization: Secondary | ICD-10-CM | POA: Diagnosis not present

## 2021-03-16 DIAGNOSIS — M67911 Unspecified disorder of synovium and tendon, right shoulder: Secondary | ICD-10-CM | POA: Diagnosis not present

## 2021-04-08 DIAGNOSIS — M67911 Unspecified disorder of synovium and tendon, right shoulder: Secondary | ICD-10-CM | POA: Diagnosis not present

## 2021-04-15 DIAGNOSIS — Z20822 Contact with and (suspected) exposure to covid-19: Secondary | ICD-10-CM | POA: Diagnosis not present

## 2021-04-20 DIAGNOSIS — M67911 Unspecified disorder of synovium and tendon, right shoulder: Secondary | ICD-10-CM | POA: Diagnosis not present

## 2021-04-22 DIAGNOSIS — H6122 Impacted cerumen, left ear: Secondary | ICD-10-CM | POA: Diagnosis not present

## 2021-04-27 DIAGNOSIS — M67911 Unspecified disorder of synovium and tendon, right shoulder: Secondary | ICD-10-CM | POA: Diagnosis not present

## 2021-04-28 DIAGNOSIS — M25511 Pain in right shoulder: Secondary | ICD-10-CM | POA: Diagnosis not present

## 2021-05-05 DIAGNOSIS — M75121 Complete rotator cuff tear or rupture of right shoulder, not specified as traumatic: Secondary | ICD-10-CM | POA: Diagnosis not present

## 2021-05-06 DIAGNOSIS — M67911 Unspecified disorder of synovium and tendon, right shoulder: Secondary | ICD-10-CM | POA: Diagnosis not present

## 2021-05-12 DIAGNOSIS — M67911 Unspecified disorder of synovium and tendon, right shoulder: Secondary | ICD-10-CM | POA: Diagnosis not present

## 2021-05-18 DIAGNOSIS — Z20822 Contact with and (suspected) exposure to covid-19: Secondary | ICD-10-CM | POA: Diagnosis not present

## 2021-05-19 DIAGNOSIS — M67911 Unspecified disorder of synovium and tendon, right shoulder: Secondary | ICD-10-CM | POA: Diagnosis not present

## 2021-05-25 DIAGNOSIS — M67911 Unspecified disorder of synovium and tendon, right shoulder: Secondary | ICD-10-CM | POA: Diagnosis not present

## 2021-05-27 DIAGNOSIS — M67911 Unspecified disorder of synovium and tendon, right shoulder: Secondary | ICD-10-CM | POA: Diagnosis not present

## 2021-05-31 DIAGNOSIS — M25511 Pain in right shoulder: Secondary | ICD-10-CM | POA: Diagnosis not present

## 2021-06-01 DIAGNOSIS — M67911 Unspecified disorder of synovium and tendon, right shoulder: Secondary | ICD-10-CM | POA: Diagnosis not present

## 2021-06-02 DIAGNOSIS — M75121 Complete rotator cuff tear or rupture of right shoulder, not specified as traumatic: Secondary | ICD-10-CM | POA: Diagnosis not present

## 2021-06-03 DIAGNOSIS — M67911 Unspecified disorder of synovium and tendon, right shoulder: Secondary | ICD-10-CM | POA: Diagnosis not present

## 2021-06-07 DIAGNOSIS — Z1211 Encounter for screening for malignant neoplasm of colon: Secondary | ICD-10-CM | POA: Diagnosis not present

## 2021-06-07 DIAGNOSIS — K5900 Constipation, unspecified: Secondary | ICD-10-CM | POA: Diagnosis not present

## 2021-06-15 DIAGNOSIS — M67911 Unspecified disorder of synovium and tendon, right shoulder: Secondary | ICD-10-CM | POA: Diagnosis not present

## 2021-06-17 DIAGNOSIS — M67911 Unspecified disorder of synovium and tendon, right shoulder: Secondary | ICD-10-CM | POA: Diagnosis not present

## 2021-06-21 DIAGNOSIS — M67911 Unspecified disorder of synovium and tendon, right shoulder: Secondary | ICD-10-CM | POA: Diagnosis not present

## 2021-06-23 DIAGNOSIS — M67911 Unspecified disorder of synovium and tendon, right shoulder: Secondary | ICD-10-CM | POA: Diagnosis not present

## 2021-06-24 DIAGNOSIS — Z1231 Encounter for screening mammogram for malignant neoplasm of breast: Secondary | ICD-10-CM | POA: Diagnosis not present

## 2021-06-24 DIAGNOSIS — M810 Age-related osteoporosis without current pathological fracture: Secondary | ICD-10-CM | POA: Diagnosis not present

## 2021-06-29 DIAGNOSIS — M67911 Unspecified disorder of synovium and tendon, right shoulder: Secondary | ICD-10-CM | POA: Diagnosis not present

## 2021-06-30 DIAGNOSIS — Z20822 Contact with and (suspected) exposure to covid-19: Secondary | ICD-10-CM | POA: Diagnosis not present

## 2021-07-01 DIAGNOSIS — M67911 Unspecified disorder of synovium and tendon, right shoulder: Secondary | ICD-10-CM | POA: Diagnosis not present

## 2021-07-12 DIAGNOSIS — Z6821 Body mass index (BMI) 21.0-21.9, adult: Secondary | ICD-10-CM | POA: Diagnosis not present

## 2021-07-12 DIAGNOSIS — Z1231 Encounter for screening mammogram for malignant neoplasm of breast: Secondary | ICD-10-CM | POA: Diagnosis not present

## 2021-07-12 DIAGNOSIS — M810 Age-related osteoporosis without current pathological fracture: Secondary | ICD-10-CM | POA: Diagnosis not present

## 2021-07-12 DIAGNOSIS — Z01419 Encounter for gynecological examination (general) (routine) without abnormal findings: Secondary | ICD-10-CM | POA: Diagnosis not present

## 2021-07-12 DIAGNOSIS — E559 Vitamin D deficiency, unspecified: Secondary | ICD-10-CM | POA: Diagnosis not present

## 2021-07-12 DIAGNOSIS — Z1211 Encounter for screening for malignant neoplasm of colon: Secondary | ICD-10-CM | POA: Diagnosis not present

## 2021-07-15 DIAGNOSIS — Z23 Encounter for immunization: Secondary | ICD-10-CM | POA: Diagnosis not present

## 2021-07-30 DIAGNOSIS — Z20822 Contact with and (suspected) exposure to covid-19: Secondary | ICD-10-CM | POA: Diagnosis not present

## 2021-08-05 DIAGNOSIS — M75121 Complete rotator cuff tear or rupture of right shoulder, not specified as traumatic: Secondary | ICD-10-CM | POA: Diagnosis not present

## 2021-08-10 DIAGNOSIS — L255 Unspecified contact dermatitis due to plants, except food: Secondary | ICD-10-CM | POA: Diagnosis not present

## 2021-08-30 DIAGNOSIS — H04123 Dry eye syndrome of bilateral lacrimal glands: Secondary | ICD-10-CM | POA: Diagnosis not present

## 2021-08-30 DIAGNOSIS — H40013 Open angle with borderline findings, low risk, bilateral: Secondary | ICD-10-CM | POA: Diagnosis not present

## 2021-08-30 DIAGNOSIS — H353131 Nonexudative age-related macular degeneration, bilateral, early dry stage: Secondary | ICD-10-CM | POA: Diagnosis not present

## 2021-08-30 DIAGNOSIS — H11153 Pinguecula, bilateral: Secondary | ICD-10-CM | POA: Diagnosis not present

## 2021-09-11 DIAGNOSIS — Z23 Encounter for immunization: Secondary | ICD-10-CM | POA: Diagnosis not present

## 2021-09-26 DIAGNOSIS — Z20822 Contact with and (suspected) exposure to covid-19: Secondary | ICD-10-CM | POA: Diagnosis not present

## 2021-10-04 DIAGNOSIS — Z20822 Contact with and (suspected) exposure to covid-19: Secondary | ICD-10-CM | POA: Diagnosis not present

## 2021-11-05 DIAGNOSIS — H903 Sensorineural hearing loss, bilateral: Secondary | ICD-10-CM | POA: Diagnosis not present

## 2021-11-18 DIAGNOSIS — H6123 Impacted cerumen, bilateral: Secondary | ICD-10-CM | POA: Diagnosis not present

## 2021-12-11 DIAGNOSIS — Z20822 Contact with and (suspected) exposure to covid-19: Secondary | ICD-10-CM | POA: Diagnosis not present

## 2021-12-14 DIAGNOSIS — M75121 Complete rotator cuff tear or rupture of right shoulder, not specified as traumatic: Secondary | ICD-10-CM | POA: Diagnosis not present

## 2021-12-14 DIAGNOSIS — M6281 Muscle weakness (generalized): Secondary | ICD-10-CM | POA: Diagnosis not present

## 2021-12-15 DIAGNOSIS — Z20822 Contact with and (suspected) exposure to covid-19: Secondary | ICD-10-CM | POA: Diagnosis not present

## 2021-12-21 DIAGNOSIS — D2261 Melanocytic nevi of right upper limb, including shoulder: Secondary | ICD-10-CM | POA: Diagnosis not present

## 2021-12-21 DIAGNOSIS — D225 Melanocytic nevi of trunk: Secondary | ICD-10-CM | POA: Diagnosis not present

## 2021-12-21 DIAGNOSIS — B070 Plantar wart: Secondary | ICD-10-CM | POA: Diagnosis not present

## 2021-12-21 DIAGNOSIS — D2262 Melanocytic nevi of left upper limb, including shoulder: Secondary | ICD-10-CM | POA: Diagnosis not present

## 2021-12-21 DIAGNOSIS — D2272 Melanocytic nevi of left lower limb, including hip: Secondary | ICD-10-CM | POA: Diagnosis not present

## 2021-12-21 DIAGNOSIS — L821 Other seborrheic keratosis: Secondary | ICD-10-CM | POA: Diagnosis not present

## 2021-12-21 DIAGNOSIS — L814 Other melanin hyperpigmentation: Secondary | ICD-10-CM | POA: Diagnosis not present

## 2021-12-21 DIAGNOSIS — L570 Actinic keratosis: Secondary | ICD-10-CM | POA: Diagnosis not present

## 2021-12-21 DIAGNOSIS — D1801 Hemangioma of skin and subcutaneous tissue: Secondary | ICD-10-CM | POA: Diagnosis not present

## 2021-12-21 DIAGNOSIS — D2271 Melanocytic nevi of right lower limb, including hip: Secondary | ICD-10-CM | POA: Diagnosis not present

## 2021-12-22 DIAGNOSIS — M6281 Muscle weakness (generalized): Secondary | ICD-10-CM | POA: Diagnosis not present

## 2021-12-22 DIAGNOSIS — M75121 Complete rotator cuff tear or rupture of right shoulder, not specified as traumatic: Secondary | ICD-10-CM | POA: Diagnosis not present

## 2021-12-24 DIAGNOSIS — M75121 Complete rotator cuff tear or rupture of right shoulder, not specified as traumatic: Secondary | ICD-10-CM | POA: Diagnosis not present

## 2021-12-24 DIAGNOSIS — M6281 Muscle weakness (generalized): Secondary | ICD-10-CM | POA: Diagnosis not present

## 2021-12-27 DIAGNOSIS — M75121 Complete rotator cuff tear or rupture of right shoulder, not specified as traumatic: Secondary | ICD-10-CM | POA: Diagnosis not present

## 2021-12-27 DIAGNOSIS — M6281 Muscle weakness (generalized): Secondary | ICD-10-CM | POA: Diagnosis not present

## 2021-12-29 DIAGNOSIS — M6281 Muscle weakness (generalized): Secondary | ICD-10-CM | POA: Diagnosis not present

## 2021-12-29 DIAGNOSIS — M75121 Complete rotator cuff tear or rupture of right shoulder, not specified as traumatic: Secondary | ICD-10-CM | POA: Diagnosis not present

## 2022-01-03 DIAGNOSIS — M75121 Complete rotator cuff tear or rupture of right shoulder, not specified as traumatic: Secondary | ICD-10-CM | POA: Diagnosis not present

## 2022-01-03 DIAGNOSIS — M6281 Muscle weakness (generalized): Secondary | ICD-10-CM | POA: Diagnosis not present

## 2022-01-05 DIAGNOSIS — M6281 Muscle weakness (generalized): Secondary | ICD-10-CM | POA: Diagnosis not present

## 2022-01-05 DIAGNOSIS — M75121 Complete rotator cuff tear or rupture of right shoulder, not specified as traumatic: Secondary | ICD-10-CM | POA: Diagnosis not present

## 2022-01-07 DIAGNOSIS — M75121 Complete rotator cuff tear or rupture of right shoulder, not specified as traumatic: Secondary | ICD-10-CM | POA: Diagnosis not present

## 2022-01-07 DIAGNOSIS — M6281 Muscle weakness (generalized): Secondary | ICD-10-CM | POA: Diagnosis not present

## 2022-01-10 DIAGNOSIS — Z20822 Contact with and (suspected) exposure to covid-19: Secondary | ICD-10-CM | POA: Diagnosis not present

## 2022-01-13 DIAGNOSIS — Z20822 Contact with and (suspected) exposure to covid-19: Secondary | ICD-10-CM | POA: Diagnosis not present

## 2022-01-27 DIAGNOSIS — Z20828 Contact with and (suspected) exposure to other viral communicable diseases: Secondary | ICD-10-CM | POA: Diagnosis not present

## 2022-01-31 DIAGNOSIS — Z20822 Contact with and (suspected) exposure to covid-19: Secondary | ICD-10-CM | POA: Diagnosis not present

## 2022-02-01 DIAGNOSIS — Z20822 Contact with and (suspected) exposure to covid-19: Secondary | ICD-10-CM | POA: Diagnosis not present

## 2022-02-18 DIAGNOSIS — Z20822 Contact with and (suspected) exposure to covid-19: Secondary | ICD-10-CM | POA: Diagnosis not present

## 2022-03-01 DIAGNOSIS — Z20822 Contact with and (suspected) exposure to covid-19: Secondary | ICD-10-CM | POA: Diagnosis not present

## 2022-03-16 DIAGNOSIS — M810 Age-related osteoporosis without current pathological fracture: Secondary | ICD-10-CM | POA: Diagnosis not present

## 2022-03-16 DIAGNOSIS — H9193 Unspecified hearing loss, bilateral: Secondary | ICD-10-CM | POA: Diagnosis not present

## 2022-03-16 DIAGNOSIS — E559 Vitamin D deficiency, unspecified: Secondary | ICD-10-CM | POA: Diagnosis not present

## 2022-03-16 DIAGNOSIS — H6122 Impacted cerumen, left ear: Secondary | ICD-10-CM | POA: Diagnosis not present

## 2022-03-16 DIAGNOSIS — Z1159 Encounter for screening for other viral diseases: Secondary | ICD-10-CM | POA: Diagnosis not present

## 2022-03-16 DIAGNOSIS — Z0000 Encounter for general adult medical examination without abnormal findings: Secondary | ICD-10-CM | POA: Diagnosis not present

## 2022-03-16 DIAGNOSIS — Z131 Encounter for screening for diabetes mellitus: Secondary | ICD-10-CM | POA: Diagnosis not present

## 2022-03-17 DIAGNOSIS — N952 Postmenopausal atrophic vaginitis: Secondary | ICD-10-CM | POA: Diagnosis not present

## 2022-03-17 DIAGNOSIS — N3946 Mixed incontinence: Secondary | ICD-10-CM | POA: Diagnosis not present

## 2022-05-06 DIAGNOSIS — H6123 Impacted cerumen, bilateral: Secondary | ICD-10-CM | POA: Diagnosis not present

## 2022-05-12 DIAGNOSIS — Z7189 Other specified counseling: Secondary | ICD-10-CM | POA: Diagnosis not present

## 2022-05-31 DIAGNOSIS — N393 Stress incontinence (female) (male): Secondary | ICD-10-CM | POA: Diagnosis not present

## 2022-06-01 DIAGNOSIS — R0981 Nasal congestion: Secondary | ICD-10-CM | POA: Diagnosis not present

## 2022-06-01 DIAGNOSIS — J329 Chronic sinusitis, unspecified: Secondary | ICD-10-CM | POA: Diagnosis not present

## 2022-06-01 DIAGNOSIS — R5383 Other fatigue: Secondary | ICD-10-CM | POA: Diagnosis not present

## 2022-06-01 DIAGNOSIS — J029 Acute pharyngitis, unspecified: Secondary | ICD-10-CM | POA: Diagnosis not present

## 2022-06-07 DIAGNOSIS — R0609 Other forms of dyspnea: Secondary | ICD-10-CM | POA: Diagnosis not present

## 2022-06-09 DIAGNOSIS — L243 Irritant contact dermatitis due to cosmetics: Secondary | ICD-10-CM | POA: Diagnosis not present

## 2022-06-09 DIAGNOSIS — B070 Plantar wart: Secondary | ICD-10-CM | POA: Diagnosis not present

## 2022-07-06 DIAGNOSIS — R0609 Other forms of dyspnea: Secondary | ICD-10-CM | POA: Diagnosis not present

## 2022-07-11 DIAGNOSIS — B070 Plantar wart: Secondary | ICD-10-CM | POA: Diagnosis not present

## 2022-07-11 DIAGNOSIS — Z23 Encounter for immunization: Secondary | ICD-10-CM | POA: Diagnosis not present

## 2022-07-12 DIAGNOSIS — Z01419 Encounter for gynecological examination (general) (routine) without abnormal findings: Secondary | ICD-10-CM | POA: Diagnosis not present

## 2022-07-12 DIAGNOSIS — Z6820 Body mass index (BMI) 20.0-20.9, adult: Secondary | ICD-10-CM | POA: Diagnosis not present

## 2022-07-12 DIAGNOSIS — L293 Anogenital pruritus, unspecified: Secondary | ICD-10-CM | POA: Diagnosis not present

## 2022-07-12 DIAGNOSIS — E559 Vitamin D deficiency, unspecified: Secondary | ICD-10-CM | POA: Diagnosis not present

## 2022-07-12 DIAGNOSIS — Z124 Encounter for screening for malignant neoplasm of cervix: Secondary | ICD-10-CM | POA: Diagnosis not present

## 2022-07-12 DIAGNOSIS — Z1211 Encounter for screening for malignant neoplasm of colon: Secondary | ICD-10-CM | POA: Diagnosis not present

## 2022-07-12 DIAGNOSIS — M810 Age-related osteoporosis without current pathological fracture: Secondary | ICD-10-CM | POA: Diagnosis not present

## 2022-07-12 DIAGNOSIS — Z1231 Encounter for screening mammogram for malignant neoplasm of breast: Secondary | ICD-10-CM | POA: Diagnosis not present

## 2022-08-25 DIAGNOSIS — H6123 Impacted cerumen, bilateral: Secondary | ICD-10-CM | POA: Diagnosis not present

## 2022-08-29 DIAGNOSIS — Z23 Encounter for immunization: Secondary | ICD-10-CM | POA: Diagnosis not present

## 2022-09-26 DIAGNOSIS — H40013 Open angle with borderline findings, low risk, bilateral: Secondary | ICD-10-CM | POA: Diagnosis not present

## 2022-09-26 DIAGNOSIS — H2513 Age-related nuclear cataract, bilateral: Secondary | ICD-10-CM | POA: Diagnosis not present

## 2022-09-26 DIAGNOSIS — H25043 Posterior subcapsular polar age-related cataract, bilateral: Secondary | ICD-10-CM | POA: Diagnosis not present

## 2022-09-26 DIAGNOSIS — H353131 Nonexudative age-related macular degeneration, bilateral, early dry stage: Secondary | ICD-10-CM | POA: Diagnosis not present

## 2022-10-10 DIAGNOSIS — N393 Stress incontinence (female) (male): Secondary | ICD-10-CM | POA: Diagnosis not present

## 2022-10-12 DIAGNOSIS — N393 Stress incontinence (female) (male): Secondary | ICD-10-CM | POA: Diagnosis not present

## 2022-10-22 DIAGNOSIS — X58XXXA Exposure to other specified factors, initial encounter: Secondary | ICD-10-CM | POA: Diagnosis not present

## 2022-10-22 DIAGNOSIS — S0501XA Injury of conjunctiva and corneal abrasion without foreign body, right eye, initial encounter: Secondary | ICD-10-CM | POA: Diagnosis not present

## 2022-10-26 DIAGNOSIS — H04123 Dry eye syndrome of bilateral lacrimal glands: Secondary | ICD-10-CM | POA: Diagnosis not present

## 2022-10-26 DIAGNOSIS — S0501XD Injury of conjunctiva and corneal abrasion without foreign body, right eye, subsequent encounter: Secondary | ICD-10-CM | POA: Diagnosis not present

## 2022-10-26 DIAGNOSIS — H02203 Unspecified lagophthalmos right eye, unspecified eyelid: Secondary | ICD-10-CM | POA: Diagnosis not present

## 2022-10-26 DIAGNOSIS — H02132 Senile ectropion of right lower eyelid: Secondary | ICD-10-CM | POA: Diagnosis not present

## 2022-11-02 DIAGNOSIS — H02203 Unspecified lagophthalmos right eye, unspecified eyelid: Secondary | ICD-10-CM | POA: Diagnosis not present

## 2022-11-02 DIAGNOSIS — H02132 Senile ectropion of right lower eyelid: Secondary | ICD-10-CM | POA: Diagnosis not present

## 2022-11-08 DIAGNOSIS — H02203 Unspecified lagophthalmos right eye, unspecified eyelid: Secondary | ICD-10-CM | POA: Diagnosis not present

## 2022-11-08 DIAGNOSIS — H02132 Senile ectropion of right lower eyelid: Secondary | ICD-10-CM | POA: Diagnosis not present

## 2022-11-10 DIAGNOSIS — N393 Stress incontinence (female) (male): Secondary | ICD-10-CM | POA: Diagnosis not present

## 2022-12-12 DIAGNOSIS — H02132 Senile ectropion of right lower eyelid: Secondary | ICD-10-CM | POA: Diagnosis not present

## 2022-12-12 DIAGNOSIS — H02203 Unspecified lagophthalmos right eye, unspecified eyelid: Secondary | ICD-10-CM | POA: Diagnosis not present

## 2022-12-13 DIAGNOSIS — H6123 Impacted cerumen, bilateral: Secondary | ICD-10-CM | POA: Diagnosis not present

## 2022-12-13 DIAGNOSIS — R946 Abnormal results of thyroid function studies: Secondary | ICD-10-CM | POA: Diagnosis not present

## 2022-12-14 DIAGNOSIS — H903 Sensorineural hearing loss, bilateral: Secondary | ICD-10-CM | POA: Diagnosis not present

## 2022-12-19 DIAGNOSIS — R946 Abnormal results of thyroid function studies: Secondary | ICD-10-CM | POA: Diagnosis not present

## 2022-12-19 DIAGNOSIS — H02102 Unspecified ectropion of right lower eyelid: Secondary | ICD-10-CM | POA: Diagnosis not present

## 2022-12-19 DIAGNOSIS — Z6821 Body mass index (BMI) 21.0-21.9, adult: Secondary | ICD-10-CM | POA: Diagnosis not present

## 2022-12-19 DIAGNOSIS — R0609 Other forms of dyspnea: Secondary | ICD-10-CM | POA: Diagnosis not present

## 2022-12-28 DIAGNOSIS — H57813 Brow ptosis, bilateral: Secondary | ICD-10-CM | POA: Diagnosis not present

## 2022-12-28 DIAGNOSIS — H02112 Cicatricial ectropion of right lower eyelid: Secondary | ICD-10-CM | POA: Diagnosis not present

## 2022-12-28 DIAGNOSIS — H02535 Eyelid retraction left lower eyelid: Secondary | ICD-10-CM | POA: Diagnosis not present

## 2022-12-28 DIAGNOSIS — H02115 Cicatricial ectropion of left lower eyelid: Secondary | ICD-10-CM | POA: Diagnosis not present

## 2022-12-28 DIAGNOSIS — H04123 Dry eye syndrome of bilateral lacrimal glands: Secondary | ICD-10-CM | POA: Diagnosis not present

## 2022-12-28 DIAGNOSIS — H0279 Other degenerative disorders of eyelid and periocular area: Secondary | ICD-10-CM | POA: Diagnosis not present

## 2022-12-28 DIAGNOSIS — H16213 Exposure keratoconjunctivitis, bilateral: Secondary | ICD-10-CM | POA: Diagnosis not present

## 2022-12-28 DIAGNOSIS — H16212 Exposure keratoconjunctivitis, left eye: Secondary | ICD-10-CM | POA: Diagnosis not present

## 2022-12-28 DIAGNOSIS — H16211 Exposure keratoconjunctivitis, right eye: Secondary | ICD-10-CM | POA: Diagnosis not present

## 2022-12-28 DIAGNOSIS — H02532 Eyelid retraction right lower eyelid: Secondary | ICD-10-CM | POA: Diagnosis not present

## 2023-01-10 ENCOUNTER — Encounter: Payer: Self-pay | Admitting: Cardiology

## 2023-01-10 ENCOUNTER — Ambulatory Visit: Payer: Medicare Other | Admitting: Cardiology

## 2023-01-10 VITALS — BP 125/71 | HR 81 | Resp 16 | Ht 63.0 in | Wt 112.0 lb

## 2023-01-10 DIAGNOSIS — E7800 Pure hypercholesterolemia, unspecified: Secondary | ICD-10-CM

## 2023-01-10 DIAGNOSIS — R0609 Other forms of dyspnea: Secondary | ICD-10-CM

## 2023-01-10 NOTE — Progress Notes (Signed)
   Primary Physician/Referring:  Kathyrn Lass, MD  Patient ID: Tina Collins, female    DOB: Jun 12, 1951, 72 y.o.   MRN: [id number]  Chief Complaint  Patient presents with  . DOE  . New Patient (Initial Visit)   HPI:    Tina Collins  is a 72 y.o. ***  History reviewed. No pertinent past medical history. History reviewed. No pertinent surgical history. Family History  Problem Relation Age of Onset  . Cancer - Lung Mother   . Heart disease Father   . Cancer - Lung Father     Social History   Tobacco Use  . Smoking status: Never  . Smokeless tobacco: Never  Substance Use Topics  . Alcohol use: Yes    Comment: occ   Marital Status: Single  ROS  ***ROS Objective      01/10/2023    1:20 PM  Vitals with BMI  Height 5\' 3"   Weight 112 lbs  BMI 99991111  Systolic 0000000  Diastolic 71  Pulse 81   SpO2: 99 %   ***Physical Exam  Medications and allergies  No Known Allergies   Medication list   Current Outpatient Medications:  .  calcium carbonate (OS-CAL) 1250 (500 Ca) MG chewable tablet, Chew 1 tablet by mouth daily., Disp: , Rfl:  .  Cholecalciferol 1.25 MG (50000 UT) capsule, Take 50,000 Units by mouth daily., Disp: , Rfl:  .  LUTEIN PO, , Disp: , Rfl:  Laboratory examination:   External labs:   Labs 06/07/2022:  S. glucose 94 mg, BUN 23, creatinine 0.87, EGFR 60 to mL, potassium 5.4, LFTs normal.  D-dimer 0.5 with mildly elevated.  Hb 13.6/HCT 41.0, platelets 195, normal indicis.  Cholesterol, total 240.000 m 03/05/2020 HDL 87.000 mg 03/05/2020 LDL 140.000 m 03/05/2020 Triglycerides 75.000 mg 03/05/2020 NHDL 153  Radiology:    Cardiac Studies:   Coronary calcium score 04/21/2020: Coronary calcium score of 0.  MESA database percentile not applicable.  Ascending and descending aortic measurements are normal. No significant extracardiac abnormality.  External echocardiogram 07/06/2022: Normal LV size, normal LV systolic function, EF 50 to 55%.  No regional  wall motion abnormality.  Grade 1 diastolic dysfunction.  No significant valvular abnormality.  EKG:   EKG 01/10/2023: Normal sinus rhythm at rate of 68 bpm, incomplete right bundle branch block.  Normal EKG.  No significant change from prior EKG from PCPs office.    Assessment     ICD-10-CM   1. DOE (dyspnea on exertion)  R06.09 EKG 12-Lead    PCV CARDIAC STRESS TEST    2. Hypercholesteremia  E78.00 Lipid Panel With LDL/HDL Ratio      10-year ASCVD risk is 9.9%.   Orders Placed This Encounter  Procedures  . Lipid Panel With LDL/HDL Ratio  . PCV CARDIAC STRESS TEST    Standing Status:   Future    Standing Expiration Date:   03/12/2023  . EKG 12-Lead   No orders of the defined types were placed in this encounter.  There are no discontinued medications.   Recommendations:   Tina Collins is a 72 y.o. Caucasian female patient with    Adrian Prows, MD, [facility] 01/10/2023, 2:03 PM Office: [phone]

## 2023-01-11 ENCOUNTER — Encounter: Payer: Self-pay | Admitting: Cardiology

## 2023-01-12 DIAGNOSIS — E7800 Pure hypercholesterolemia, unspecified: Secondary | ICD-10-CM | POA: Diagnosis not present

## 2023-01-13 LAB — LIPID PANEL WITH LDL/HDL RATIO
Cholesterol, Total: 236 mg/dL — ABNORMAL HIGH (ref 100–199)
HDL: 99 mg/dL (ref >39)
LDL Chol Calc (NIH): 128 mg/dL — ABNORMAL HIGH (ref 0–99)
LDL/HDL Ratio: 1.3 ratio (ref 0.0–3.2)
Triglycerides: 54 mg/dL (ref 0–149)
VLDL Cholesterol Cal: 9 mg/dL (ref 5–40)

## 2023-02-23 ENCOUNTER — Ambulatory Visit: Payer: Medicare Other

## 2023-02-23 DIAGNOSIS — R0609 Other forms of dyspnea: Secondary | ICD-10-CM

## 2023-02-28 DIAGNOSIS — H40013 Open angle with borderline findings, low risk, bilateral: Secondary | ICD-10-CM | POA: Diagnosis not present

## 2023-02-28 DIAGNOSIS — H353131 Nonexudative age-related macular degeneration, bilateral, early dry stage: Secondary | ICD-10-CM | POA: Diagnosis not present

## 2023-03-02 DIAGNOSIS — L821 Other seborrheic keratosis: Secondary | ICD-10-CM | POA: Diagnosis not present

## 2023-03-02 DIAGNOSIS — D2271 Melanocytic nevi of right lower limb, including hip: Secondary | ICD-10-CM | POA: Diagnosis not present

## 2023-03-02 DIAGNOSIS — L720 Epidermal cyst: Secondary | ICD-10-CM | POA: Diagnosis not present

## 2023-03-02 DIAGNOSIS — D225 Melanocytic nevi of trunk: Secondary | ICD-10-CM | POA: Diagnosis not present

## 2023-03-02 DIAGNOSIS — D1801 Hemangioma of skin and subcutaneous tissue: Secondary | ICD-10-CM | POA: Diagnosis not present

## 2023-03-02 DIAGNOSIS — D2261 Melanocytic nevi of right upper limb, including shoulder: Secondary | ICD-10-CM | POA: Diagnosis not present

## 2023-03-05 NOTE — Progress Notes (Signed)
Let her know the stress test was normal and she has excellent effort tolerance

## 2023-03-06 DIAGNOSIS — H938X3 Other specified disorders of ear, bilateral: Secondary | ICD-10-CM | POA: Diagnosis not present

## 2023-03-06 NOTE — Progress Notes (Signed)
Gave patient results. She acknowledged information and had no further questions.

## 2023-03-08 ENCOUNTER — Encounter: Payer: Self-pay | Admitting: Cardiology

## 2023-03-08 NOTE — Telephone Encounter (Signed)
From patient.

## 2023-05-10 DIAGNOSIS — M810 Age-related osteoporosis without current pathological fracture: Secondary | ICD-10-CM | POA: Diagnosis not present

## 2023-05-10 DIAGNOSIS — Z0000 Encounter for general adult medical examination without abnormal findings: Secondary | ICD-10-CM | POA: Diagnosis not present

## 2023-05-10 DIAGNOSIS — Z6820 Body mass index (BMI) 20.0-20.9, adult: Secondary | ICD-10-CM | POA: Diagnosis not present

## 2023-05-10 DIAGNOSIS — E7800 Pure hypercholesterolemia, unspecified: Secondary | ICD-10-CM | POA: Diagnosis not present

## 2023-05-10 DIAGNOSIS — Z23 Encounter for immunization: Secondary | ICD-10-CM | POA: Diagnosis not present

## 2023-05-25 DIAGNOSIS — H903 Sensorineural hearing loss, bilateral: Secondary | ICD-10-CM | POA: Diagnosis not present

## 2023-05-25 DIAGNOSIS — H6123 Impacted cerumen, bilateral: Secondary | ICD-10-CM | POA: Diagnosis not present

## 2023-07-04 DIAGNOSIS — B078 Other viral warts: Secondary | ICD-10-CM | POA: Diagnosis not present

## 2023-07-04 DIAGNOSIS — L538 Other specified erythematous conditions: Secondary | ICD-10-CM | POA: Diagnosis not present

## 2023-07-04 DIAGNOSIS — L298 Other pruritus: Secondary | ICD-10-CM | POA: Diagnosis not present

## 2023-07-04 DIAGNOSIS — Z7189 Other specified counseling: Secondary | ICD-10-CM | POA: Diagnosis not present

## 2023-07-04 DIAGNOSIS — R208 Other disturbances of skin sensation: Secondary | ICD-10-CM | POA: Diagnosis not present

## 2023-07-18 DIAGNOSIS — Z7189 Other specified counseling: Secondary | ICD-10-CM | POA: Diagnosis not present

## 2023-07-18 DIAGNOSIS — L538 Other specified erythematous conditions: Secondary | ICD-10-CM | POA: Diagnosis not present

## 2023-07-18 DIAGNOSIS — B078 Other viral warts: Secondary | ICD-10-CM | POA: Diagnosis not present

## 2023-07-18 DIAGNOSIS — L298 Other pruritus: Secondary | ICD-10-CM | POA: Diagnosis not present

## 2023-07-18 DIAGNOSIS — R208 Other disturbances of skin sensation: Secondary | ICD-10-CM | POA: Diagnosis not present

## 2023-07-29 DIAGNOSIS — Z23 Encounter for immunization: Secondary | ICD-10-CM | POA: Diagnosis not present

## 2023-08-02 ENCOUNTER — Ambulatory Visit (INDEPENDENT_AMBULATORY_CARE_PROVIDER_SITE_OTHER): Payer: Medicare Other | Admitting: Podiatry

## 2023-08-02 ENCOUNTER — Encounter: Payer: Self-pay | Admitting: Podiatry

## 2023-08-02 DIAGNOSIS — B070 Plantar wart: Secondary | ICD-10-CM | POA: Diagnosis not present

## 2023-08-02 NOTE — Progress Notes (Signed)
  Subjective:  Patient ID: Tina Collins, female    DOB: 1951-07-14,   MRN: [id number]  Chief Complaint  Patient presents with   Plantar Warts    Pt presents with a wart between on her third toe on the right.    72 y.o. female presents for concern as above. Has been present for a while now. Has been seen by dermatology and done two different treatments one with liquid nitrogen. Patient relates some pain in the area and relates she is very nervous about treatments . Denies any other pedal complaints. Denies n/v/f/c.   Past Medical History:  Diagnosis Date   Hyperlipidemia     Objective:  Physical Exam: Vascular: DP/PT pulses 2/4 bilateral. CFT <3 seconds. Normal hair growth on digits. No edema.  Skin. No lacerations or abrasions bilateral feet. Right third digit lesion. Multiple capillary budding is noted throughout with cauliflower-like appearance and loss of skin tension lines within the lesion itself. Musculoskeletal: MMT 5/5 bilateral lower extremities in DF, PF, Inversion and Eversion. Deceased ROM in DF of ankle joint.  Neurological: Sensation intact to light touch.   Assessment:   1. Plantar wart, right foot      Plan:  Patient was evaluated and treated and all questions answered. -Discussed warts and their etiology with patient and treatment options.  -Hyperkeratotic tissue was debrided with chisel without incident.  -Applied tric-chloro acid treatment to area with dressing. Advised to remove bandaging tomorrow.  -Recommend use of OTC compound W.  -Discussed future options such as laser treatment if unsuccessful.  -Advised good supportive shoes and inserts -Patient to return to office in 3 weeks or sooner if condition worsens.   Louann Sjogren, DPM

## 2023-08-07 DIAGNOSIS — E559 Vitamin D deficiency, unspecified: Secondary | ICD-10-CM | POA: Diagnosis not present

## 2023-08-07 DIAGNOSIS — Z1231 Encounter for screening mammogram for malignant neoplasm of breast: Secondary | ICD-10-CM | POA: Diagnosis not present

## 2023-08-07 DIAGNOSIS — Z1211 Encounter for screening for malignant neoplasm of colon: Secondary | ICD-10-CM | POA: Diagnosis not present

## 2023-08-07 DIAGNOSIS — Z1330 Encounter for screening examination for mental health and behavioral disorders, unspecified: Secondary | ICD-10-CM | POA: Diagnosis not present

## 2023-08-07 DIAGNOSIS — Z6820 Body mass index (BMI) 20.0-20.9, adult: Secondary | ICD-10-CM | POA: Diagnosis not present

## 2023-08-07 DIAGNOSIS — M810 Age-related osteoporosis without current pathological fracture: Secondary | ICD-10-CM | POA: Diagnosis not present

## 2023-08-07 DIAGNOSIS — Z01419 Encounter for gynecological examination (general) (routine) without abnormal findings: Secondary | ICD-10-CM | POA: Diagnosis not present

## 2023-08-21 DIAGNOSIS — R600 Localized edema: Secondary | ICD-10-CM | POA: Diagnosis not present

## 2023-08-22 DIAGNOSIS — Z780 Asymptomatic menopausal state: Secondary | ICD-10-CM | POA: Diagnosis not present

## 2023-08-22 DIAGNOSIS — M810 Age-related osteoporosis without current pathological fracture: Secondary | ICD-10-CM | POA: Diagnosis not present

## 2023-08-23 ENCOUNTER — Encounter: Payer: Self-pay | Admitting: Podiatry

## 2023-08-23 ENCOUNTER — Ambulatory Visit (INDEPENDENT_AMBULATORY_CARE_PROVIDER_SITE_OTHER): Payer: Medicare Other | Admitting: Podiatry

## 2023-08-23 VITALS — Ht 63.0 in | Wt 112.0 lb

## 2023-08-23 DIAGNOSIS — B070 Plantar wart: Secondary | ICD-10-CM

## 2023-08-23 NOTE — Progress Notes (Signed)
  Subjective:  Patient ID: Tina Collins, female    DOB: 04/13/1951,   MRN: [id number]  Chief Complaint  Patient presents with   Plantar Warts    F/U wart on right foot    72 y.o. female presents for follow-up of wart. Relates some improvement and treating herself once.  . Denies any other pedal complaints. Denies n/v/f/c.   Past Medical History:  Diagnosis Date   Hyperlipidemia     Objective:  Physical Exam: Vascular: DP/PT pulses 2/4 bilateral. CFT <3 seconds. Normal hair growth on digits. No edema.  Skin. No lacerations or abrasions bilateral feet. Right third digit lesion. Multiple capillary budding is noted throughout with cauliflower-like appearance and loss of skin tension lines within the lesion itself. Musculoskeletal: MMT 5/5 bilateral lower extremities in DF, PF, Inversion and Eversion. Deceased ROM in DF of ankle joint.  Neurological: Sensation intact to light touch.   Assessment:   1. Plantar wart, right foot       Plan:  Patient was evaluated and treated and all questions answered. -Discussed warts and their etiology with patient and treatment options.  -Hyperkeratotic tissue was debrided with chisel without incident.  -Applied cantharone treatment to area with dressing. Advised to remove bandaging tomorrow.  -Recommend use of OTC compound W.  -Discussed future options such as laser treatment if unsuccessful.  -Advised good supportive shoes and inserts -Patient to return to office in 3 weeks or sooner if condition worsens.   Louann Sjogren, DPM

## 2023-09-04 DIAGNOSIS — Z23 Encounter for immunization: Secondary | ICD-10-CM | POA: Diagnosis not present

## 2023-09-07 DIAGNOSIS — R5383 Other fatigue: Secondary | ICD-10-CM | POA: Diagnosis not present

## 2023-09-07 DIAGNOSIS — M810 Age-related osteoporosis without current pathological fracture: Secondary | ICD-10-CM | POA: Diagnosis not present

## 2023-09-13 DIAGNOSIS — L821 Other seborrheic keratosis: Secondary | ICD-10-CM | POA: Diagnosis not present

## 2023-09-18 DIAGNOSIS — M75121 Complete rotator cuff tear or rupture of right shoulder, not specified as traumatic: Secondary | ICD-10-CM | POA: Diagnosis not present

## 2023-09-26 DIAGNOSIS — M6281 Muscle weakness (generalized): Secondary | ICD-10-CM | POA: Diagnosis not present

## 2023-09-26 DIAGNOSIS — M75121 Complete rotator cuff tear or rupture of right shoulder, not specified as traumatic: Secondary | ICD-10-CM | POA: Diagnosis not present

## 2023-09-29 DIAGNOSIS — M75121 Complete rotator cuff tear or rupture of right shoulder, not specified as traumatic: Secondary | ICD-10-CM | POA: Diagnosis not present

## 2023-09-29 DIAGNOSIS — M6281 Muscle weakness (generalized): Secondary | ICD-10-CM | POA: Diagnosis not present

## 2023-10-03 ENCOUNTER — Ambulatory Visit (INDEPENDENT_AMBULATORY_CARE_PROVIDER_SITE_OTHER): Payer: Medicare Other | Admitting: Podiatry

## 2023-10-03 DIAGNOSIS — B070 Plantar wart: Secondary | ICD-10-CM

## 2023-10-03 DIAGNOSIS — H6123 Impacted cerumen, bilateral: Secondary | ICD-10-CM | POA: Diagnosis not present

## 2023-10-03 NOTE — Progress Notes (Signed)
  Subjective:  Patient ID: Carlyle Basques, female    DOB: [birth year],   MRN: [id number]  No chief complaint on file.   72 y.o. female presents for follow-up of wart. Relates some improvement and treating herself once.  . Denies any other pedal complaints. Denies n/v/f/c.   Past Medical History:  Diagnosis Date   Hyperlipidemia     Objective:  Physical Exam: Vascular: DP/PT pulses 2/4 bilateral. CFT <3 seconds. Normal hair growth on digits. No edema.  Skin. No lacerations or abrasions bilateral feet. Right third digit lesion. Multiple capillary budding is noted throughout with cauliflower-like appearance and loss of skin tension lines within the lesion itself. Improving Musculoskeletal: MMT 5/5 bilateral lower extremities in DF, PF, Inversion and Eversion. Deceased ROM in DF of ankle joint.  Neurological: Sensation intact to light touch.   Assessment:   1. Plantar wart, right foot       Plan:  Patient was evaluated and treated and all questions answered. -Discussed warts and their etiology with patient and treatment options.  -Hyperkeratotic tissue was debrided with chisel without incident.  -Applied cantharone treatment to area with dressing. Advised to remove bandaging tomorrow.  -Recommend use of OTC compound W.  -Discussed future options such as laser treatment if unsuccessful.  -Advised good supportive shoes and inserts -Patient to return to office in 3 weeks or sooner if condition worsens.   Louann Sjogren, DPM

## 2023-10-04 DIAGNOSIS — M6281 Muscle weakness (generalized): Secondary | ICD-10-CM | POA: Diagnosis not present

## 2023-10-04 DIAGNOSIS — M75121 Complete rotator cuff tear or rupture of right shoulder, not specified as traumatic: Secondary | ICD-10-CM | POA: Diagnosis not present

## 2023-10-05 DIAGNOSIS — H11153 Pinguecula, bilateral: Secondary | ICD-10-CM | POA: Diagnosis not present

## 2023-10-05 DIAGNOSIS — H25813 Combined forms of age-related cataract, bilateral: Secondary | ICD-10-CM | POA: Diagnosis not present

## 2023-10-05 DIAGNOSIS — H40013 Open angle with borderline findings, low risk, bilateral: Secondary | ICD-10-CM | POA: Diagnosis not present

## 2023-10-05 DIAGNOSIS — H353131 Nonexudative age-related macular degeneration, bilateral, early dry stage: Secondary | ICD-10-CM | POA: Diagnosis not present

## 2023-10-06 DIAGNOSIS — M75121 Complete rotator cuff tear or rupture of right shoulder, not specified as traumatic: Secondary | ICD-10-CM | POA: Diagnosis not present

## 2023-10-06 DIAGNOSIS — M6281 Muscle weakness (generalized): Secondary | ICD-10-CM | POA: Diagnosis not present

## 2023-10-09 DIAGNOSIS — M6281 Muscle weakness (generalized): Secondary | ICD-10-CM | POA: Diagnosis not present

## 2023-10-09 DIAGNOSIS — M75121 Complete rotator cuff tear or rupture of right shoulder, not specified as traumatic: Secondary | ICD-10-CM | POA: Diagnosis not present

## 2023-10-11 DIAGNOSIS — M75121 Complete rotator cuff tear or rupture of right shoulder, not specified as traumatic: Secondary | ICD-10-CM | POA: Diagnosis not present

## 2023-10-11 DIAGNOSIS — M6281 Muscle weakness (generalized): Secondary | ICD-10-CM | POA: Diagnosis not present

## 2023-10-23 DIAGNOSIS — M791 Myalgia, unspecified site: Secondary | ICD-10-CM | POA: Diagnosis not present

## 2023-10-23 DIAGNOSIS — Z681 Body mass index (BMI) 19 or less, adult: Secondary | ICD-10-CM | POA: Diagnosis not present

## 2023-10-23 DIAGNOSIS — J00 Acute nasopharyngitis [common cold]: Secondary | ICD-10-CM | POA: Diagnosis not present

## 2023-11-01 DIAGNOSIS — M6281 Muscle weakness (generalized): Secondary | ICD-10-CM | POA: Diagnosis not present

## 2023-11-01 DIAGNOSIS — M75121 Complete rotator cuff tear or rupture of right shoulder, not specified as traumatic: Secondary | ICD-10-CM | POA: Diagnosis not present

## 2023-11-06 DIAGNOSIS — M6281 Muscle weakness (generalized): Secondary | ICD-10-CM | POA: Diagnosis not present

## 2023-11-06 DIAGNOSIS — M75121 Complete rotator cuff tear or rupture of right shoulder, not specified as traumatic: Secondary | ICD-10-CM | POA: Diagnosis not present

## 2023-11-08 ENCOUNTER — Ambulatory Visit (INDEPENDENT_AMBULATORY_CARE_PROVIDER_SITE_OTHER): Payer: Medicare Other | Admitting: Podiatry

## 2023-11-08 DIAGNOSIS — M6281 Muscle weakness (generalized): Secondary | ICD-10-CM | POA: Diagnosis not present

## 2023-11-08 DIAGNOSIS — B070 Plantar wart: Secondary | ICD-10-CM | POA: Diagnosis not present

## 2023-11-08 DIAGNOSIS — M75121 Complete rotator cuff tear or rupture of right shoulder, not specified as traumatic: Secondary | ICD-10-CM | POA: Diagnosis not present

## 2023-11-08 NOTE — Progress Notes (Signed)
  Subjective:  Patient ID: Tina Collins, female    DOB: 1950/12/18,   MRN: [id number]  No chief complaint on file.   73 y.o. female presents for follow-up of wart. Relates concern for worsening and treating herself once.  . Denies any other pedal complaints. Denies n/v/f/c.   Past Medical History:  Diagnosis Date   Hyperlipidemia     Objective:  Physical Exam: Vascular: DP/PT pulses 2/4 bilateral. CFT <3 seconds. Normal hair growth on digits. No edema.  Skin. No lacerations or abrasions bilateral feet. Right third digit lesion. Multiple capillary budding is noted throughout with cauliflower-like appearance and loss of skin tension lines within the lesion itself. Improving Musculoskeletal: MMT 5/5 bilateral lower extremities in DF, PF, Inversion and Eversion. Deceased ROM in DF of ankle joint.  Neurological: Sensation intact to light touch.   Assessment:   1. Plantar wart, right foot        Plan:  Patient was evaluated and treated and all questions answered. -Discussed warts and their etiology with patient and treatment options.  -Hyperkeratotic tissue was debrided with chisel without incident. Patient unable to tolerate much.  -Applied cantharone treatment to area with dressing. Advised to remove bandaging tomorrow.  -Recommend use of OTC compound W.  -Discussed future options such as laser treatment. Will get scheduled to try this as well to aid with improvement.  -Advised good supportive shoes and inserts -Patient to return to office in 3 weeks or sooner if condition worsens.   Jennefer Moats, DPM

## 2023-11-14 ENCOUNTER — Encounter: Payer: Self-pay | Admitting: Podiatry

## 2023-11-14 DIAGNOSIS — M6281 Muscle weakness (generalized): Secondary | ICD-10-CM | POA: Diagnosis not present

## 2023-11-14 DIAGNOSIS — M75121 Complete rotator cuff tear or rupture of right shoulder, not specified as traumatic: Secondary | ICD-10-CM | POA: Diagnosis not present

## 2023-11-16 DIAGNOSIS — M6281 Muscle weakness (generalized): Secondary | ICD-10-CM | POA: Diagnosis not present

## 2023-11-16 DIAGNOSIS — M75121 Complete rotator cuff tear or rupture of right shoulder, not specified as traumatic: Secondary | ICD-10-CM | POA: Diagnosis not present

## 2023-11-20 ENCOUNTER — Ambulatory Visit (INDEPENDENT_AMBULATORY_CARE_PROVIDER_SITE_OTHER): Payer: Medicare Other | Admitting: Podiatrist

## 2023-11-20 DIAGNOSIS — M6281 Muscle weakness (generalized): Secondary | ICD-10-CM | POA: Diagnosis not present

## 2023-11-20 DIAGNOSIS — M75121 Complete rotator cuff tear or rupture of right shoulder, not specified as traumatic: Secondary | ICD-10-CM | POA: Diagnosis not present

## 2023-11-20 DIAGNOSIS — B070 Plantar wart: Secondary | ICD-10-CM | POA: Diagnosis not present

## 2023-11-20 NOTE — Progress Notes (Signed)
Patient presents today for the laser treatment # 1- of wart- right third digit- medial side of the toe. . Diagnosed with verucca by Dr. Ralene Cork who has tried canthacur with no improvement in symptoms.    All other systems are negative.  Hyperkeratotic tissue debrided with a chisel blade    Laser therapy via [facility] Laser therapy system on the wart setting P1  was admininstered to affected area of the third toe right foot.  The Patient tolerated the treatment well. All safety precautions were in place.   Salinocaine was applied to the wart followed by tape.    She has an appointment with Dr. Ralene Cork in February and will follow up with her-  discussed she might need another laser treatment to see if this modality will help with the wart resolution.

## 2023-11-23 DIAGNOSIS — M75121 Complete rotator cuff tear or rupture of right shoulder, not specified as traumatic: Secondary | ICD-10-CM | POA: Diagnosis not present

## 2023-11-23 DIAGNOSIS — M6281 Muscle weakness (generalized): Secondary | ICD-10-CM | POA: Diagnosis not present

## 2023-11-27 ENCOUNTER — Ambulatory Visit (INDEPENDENT_AMBULATORY_CARE_PROVIDER_SITE_OTHER): Payer: Medicare Other | Admitting: Podiatry

## 2023-11-27 ENCOUNTER — Encounter: Payer: Self-pay | Admitting: Podiatry

## 2023-11-27 DIAGNOSIS — B070 Plantar wart: Secondary | ICD-10-CM | POA: Diagnosis not present

## 2023-11-27 NOTE — Progress Notes (Signed)
  Subjective:  Patient ID: Tina Collins, female    DOB: [birth year],   MRN: [id number]  No chief complaint on file.   73 y.o. female presents for follow-up of wart. Relates she believes the laser is helping and has been doing better.  . Denies any other pedal complaints. Denies n/v/f/c.   Past Medical History:  Diagnosis Date   Hyperlipidemia     Objective:  Physical Exam: Vascular: DP/PT pulses 2/4 bilateral. CFT <3 seconds. Normal hair growth on digits. No edema.  Skin. No lacerations or abrasions bilateral feet. Right third digit lesion. Multiple capillary budding is noted throughout with cauliflower-like appearance and loss of skin tension lines within the lesion itself. Improving Musculoskeletal: MMT 5/5 bilateral lower extremities in DF, PF, Inversion and Eversion. Deceased ROM in DF of ankle joint.  Neurological: Sensation intact to light touch.   Assessment:   1. Plantar wart, right foot        Plan:  Patient was evaluated and treated and all questions answered. -Discussed warts and their etiology with patient and treatment options.  -Hyperkeratotic tissue was debrided with chisel without incident. Was able to get a significant more amount of tissue removed today compared to previous.  -Applied cantharone treatment to area with dressing. Advised to remove bandaging tomorrow.  -Recommend use of OTC compound W.  -Will try another laser treatment as well. Will get scheduled -Advised good supportive shoes and inserts -Patient to return to office in 3 weeks or sooner if condition worsens.   Louann Sjogren, DPM

## 2023-11-29 DIAGNOSIS — M6281 Muscle weakness (generalized): Secondary | ICD-10-CM | POA: Diagnosis not present

## 2023-11-29 DIAGNOSIS — M75121 Complete rotator cuff tear or rupture of right shoulder, not specified as traumatic: Secondary | ICD-10-CM | POA: Diagnosis not present

## 2023-11-30 ENCOUNTER — Encounter: Payer: Self-pay | Admitting: Podiatry

## 2023-12-04 ENCOUNTER — Other Ambulatory Visit: Payer: Medicare Other

## 2023-12-07 DIAGNOSIS — M6281 Muscle weakness (generalized): Secondary | ICD-10-CM | POA: Diagnosis not present

## 2023-12-07 DIAGNOSIS — M75121 Complete rotator cuff tear or rupture of right shoulder, not specified as traumatic: Secondary | ICD-10-CM | POA: Diagnosis not present

## 2023-12-08 DIAGNOSIS — M6281 Muscle weakness (generalized): Secondary | ICD-10-CM | POA: Diagnosis not present

## 2023-12-08 DIAGNOSIS — M75121 Complete rotator cuff tear or rupture of right shoulder, not specified as traumatic: Secondary | ICD-10-CM | POA: Diagnosis not present

## 2023-12-11 ENCOUNTER — Encounter: Payer: Self-pay | Admitting: Cardiology

## 2023-12-11 DIAGNOSIS — M75121 Complete rotator cuff tear or rupture of right shoulder, not specified as traumatic: Secondary | ICD-10-CM | POA: Diagnosis not present

## 2023-12-11 DIAGNOSIS — R0609 Other forms of dyspnea: Secondary | ICD-10-CM

## 2023-12-11 DIAGNOSIS — M6281 Muscle weakness (generalized): Secondary | ICD-10-CM | POA: Diagnosis not present

## 2023-12-11 DIAGNOSIS — E7800 Pure hypercholesterolemia, unspecified: Secondary | ICD-10-CM

## 2023-12-11 NOTE — Telephone Encounter (Signed)
Fasting lipids and LPA then elective visit me me please. She is active on MyChart

## 2023-12-13 NOTE — Telephone Encounter (Signed)
Just routine visit so no urgency. 2 months is okay. Just MyChart message her and if she wants to come in early, I will try to get her in soon

## 2023-12-22 DIAGNOSIS — M75121 Complete rotator cuff tear or rupture of right shoulder, not specified as traumatic: Secondary | ICD-10-CM | POA: Diagnosis not present

## 2023-12-22 NOTE — Telephone Encounter (Signed)
 ICD-10-CM   1. Hypercholesteremia  E78.00 Lipid Profile    Lipoprotein A (LPA)    2. DOE (dyspnea on exertion)  R06.09 ABO/Rh     Orders Placed This Encounter  Procedures   Lipid Profile   Lipoprotein A (LPA)   ABO/Rh

## 2023-12-26 ENCOUNTER — Ambulatory Visit: Payer: Medicare Other | Admitting: Podiatry

## 2023-12-26 DIAGNOSIS — E7800 Pure hypercholesterolemia, unspecified: Secondary | ICD-10-CM | POA: Diagnosis not present

## 2023-12-26 DIAGNOSIS — R0609 Other forms of dyspnea: Secondary | ICD-10-CM | POA: Diagnosis not present

## 2023-12-27 ENCOUNTER — Encounter: Payer: Self-pay | Admitting: Cardiology

## 2023-12-27 LAB — LIPOPROTEIN A (LPA): Lipoprotein (a): 169.3 nmol/L — ABNORMAL HIGH (ref <75.0)

## 2023-12-27 LAB — LIPID PANEL
Chol/HDL Ratio: 2.5 ratio (ref 0.0–4.4)
Cholesterol, Total: 223 mg/dL — ABNORMAL HIGH (ref 100–199)
HDL: 90 mg/dL (ref >39)
LDL Chol Calc (NIH): 125 mg/dL — ABNORMAL HIGH (ref 0–99)
Triglycerides: 47 mg/dL (ref 0–149)
VLDL Cholesterol Cal: 8 mg/dL (ref 5–40)

## 2023-12-27 LAB — ABO/RH: Rh Factor: POSITIVE

## 2023-12-27 NOTE — Progress Notes (Signed)
 Elevated lipids and will discuss on visit soon

## 2023-12-29 ENCOUNTER — Other Ambulatory Visit: Payer: Medicare Other

## 2024-01-01 ENCOUNTER — Ambulatory Visit (INDEPENDENT_AMBULATORY_CARE_PROVIDER_SITE_OTHER): Payer: Medicare Other | Admitting: Podiatry

## 2024-01-01 DIAGNOSIS — B070 Plantar wart: Secondary | ICD-10-CM | POA: Diagnosis not present

## 2024-01-01 NOTE — Progress Notes (Signed)
  Subjective:  Patient ID: Tina Collins, female    DOB: 1951/03/15,   MRN: [id number]  No chief complaint on file.   73 y.o. female presents for follow-up of wart. Relates she believes she is doing much better.   . Denies any other pedal complaints. Denies n/v/f/c.   Past Medical History:  Diagnosis Date   Hyperlipidemia     Objective:  Physical Exam: Vascular: DP/PT pulses 2/4 bilateral. CFT <3 seconds. Normal hair growth on digits. No edema.  Skin. No lacerations or abrasions bilateral feet. Right third digit lesion. Multiple capillary budding is noted throughout with cauliflower-like appearance and loss of skin tension lines within the lesion itself. Much improved but still some present.  Musculoskeletal: MMT 5/5 bilateral lower extremities in DF, PF, Inversion and Eversion. Deceased ROM in DF of ankle joint.  Neurological: Sensation intact to light touch.   Assessment:   1. Plantar wart, right foot         Plan:  Patient was evaluated and treated and all questions answered. -Discussed warts and their etiology with patient and treatment options.  -Hyperkeratotic tissue was debrided mechanically without incident minimally today   -Applied cantharone treatment to area with dressing. Advised to remove bandaging tomorrow.  -Recommend use of OTC compound W.  -Will try another laser treatment as well. Will get scheduled -Advised good supportive shoes and inserts -Patient to return to office in 3 weeks or sooner if condition worsens.   Louann Sjogren, DPM

## 2024-01-07 ENCOUNTER — Encounter: Payer: Self-pay | Admitting: Cardiology

## 2024-01-08 ENCOUNTER — Other Ambulatory Visit: Payer: Medicare Other

## 2024-01-22 ENCOUNTER — Ambulatory Visit (INDEPENDENT_AMBULATORY_CARE_PROVIDER_SITE_OTHER): Admitting: Podiatry

## 2024-01-22 ENCOUNTER — Encounter: Payer: Self-pay | Admitting: Podiatry

## 2024-01-22 DIAGNOSIS — B070 Plantar wart: Secondary | ICD-10-CM

## 2024-01-22 NOTE — Progress Notes (Signed)
  Subjective:  Patient ID: Tina Collins, female    DOB: 1951/03/15,   MRN: [id number]  No chief complaint on file.   73 y.o. female presents for follow-up of wart. Relates she believes she is doing much better.   . Denies any other pedal complaints. Denies n/v/f/c.   Past Medical History:  Diagnosis Date   Hyperlipidemia     Objective:  Physical Exam: Vascular: DP/PT pulses 2/4 bilateral. CFT <3 seconds. Normal hair growth on digits. No edema.  Skin. No lacerations or abrasions bilateral feet. Right third digit lesion. Multiple capillary budding is noted throughout with cauliflower-like appearance and loss of skin tension lines within the lesion itself. Much improved but still some present.  Musculoskeletal: MMT 5/5 bilateral lower extremities in DF, PF, Inversion and Eversion. Deceased ROM in DF of ankle joint.  Neurological: Sensation intact to light touch.   Assessment:   1. Plantar wart, right foot         Plan:  Patient was evaluated and treated and all questions answered. -Discussed warts and their etiology with patient and treatment options.  -Hyperkeratotic tissue was debrided mechanically without incident minimally today   -Applied cantharone treatment to area with dressing. Advised to remove bandaging tomorrow.  -Recommend use of OTC compound W.  -Will try another laser treatment as well. Will get scheduled -Advised good supportive shoes and inserts -Patient to return to office in 3 weeks or sooner if condition worsens.   Louann Sjogren, DPM

## 2024-02-21 ENCOUNTER — Ambulatory Visit (INDEPENDENT_AMBULATORY_CARE_PROVIDER_SITE_OTHER): Admitting: Podiatry

## 2024-02-21 ENCOUNTER — Ambulatory Visit: Admitting: Podiatry

## 2024-02-21 DIAGNOSIS — B070 Plantar wart: Secondary | ICD-10-CM | POA: Diagnosis not present

## 2024-02-21 NOTE — Progress Notes (Signed)
  Subjective:  Patient ID: Tina Collins, female    DOB: 1951/01/22,   MRN: [id number]  Chief Complaint  Patient presents with   Plantar Warts    Pt presents for a follow up of plantar wart, right foot states she is doing well.    73 y.o. female presents for follow-up of wart. Relates she believes she is doing much better and believes it is gone.   . Denies any other pedal complaints. Denies n/v/f/c.   Past Medical History:  Diagnosis Date   Hyperlipidemia     Objective:  Physical Exam: Vascular: DP/PT pulses 2/4 bilateral. CFT <3 seconds. Normal hair growth on digits. No edema.  Skin. No lacerations or abrasions bilateral feet. Right third digit lesion resolved.  Musculoskeletal: MMT 5/5 bilateral lower extremities in DF, PF, Inversion and Eversion. Deceased ROM in DF of ankle joint.  Neurological: Sensation intact to light touch.   Assessment:   1. Plantar wart, right foot           Plan:  Patient was evaluated and treated and all questions answered. -Discussed warts and their etiology with patient and treatment options.  -Do feel wart has completely resolved and no signs of reoccurance.  -Advised good supportive shoes and inserts -Patient to return to office in future if notices any changes.    Jennefer Moats, DPM

## 2024-02-22 ENCOUNTER — Ambulatory Visit: Payer: Medicare Other | Admitting: Cardiology

## 2024-02-27 DIAGNOSIS — Z974 Presence of external hearing-aid: Secondary | ICD-10-CM | POA: Diagnosis not present

## 2024-02-27 DIAGNOSIS — H6123 Impacted cerumen, bilateral: Secondary | ICD-10-CM | POA: Diagnosis not present

## 2024-02-29 DIAGNOSIS — H40013 Open angle with borderline findings, low risk, bilateral: Secondary | ICD-10-CM | POA: Diagnosis not present

## 2024-03-04 ENCOUNTER — Encounter: Payer: Self-pay | Admitting: Cardiology

## 2024-03-04 ENCOUNTER — Ambulatory Visit: Payer: Medicare Other | Attending: Cardiology | Admitting: Cardiology

## 2024-03-04 ENCOUNTER — Ambulatory Visit: Payer: Medicare Other | Admitting: Cardiology

## 2024-03-04 VITALS — BP 116/68 | HR 79 | Resp 16 | Ht 63.0 in | Wt 112.6 lb

## 2024-03-04 DIAGNOSIS — E7800 Pure hypercholesterolemia, unspecified: Secondary | ICD-10-CM | POA: Diagnosis not present

## 2024-03-04 DIAGNOSIS — R0609 Other forms of dyspnea: Secondary | ICD-10-CM | POA: Diagnosis present

## 2024-03-04 NOTE — Addendum Note (Signed)
 Addended by: Tomeko Scoville L on: 03/04/2024 01:24 PM   Modules accepted: Orders

## 2024-03-04 NOTE — Progress Notes (Signed)
 Cardiology Office Note:  .   Date:  03/04/2024  ID:  Tina Collins, DOB [birth year], MRN [id number] PCP: Perley Bradley, MD  [facility] Providers Cardiologist:  Knox Perl, MD   History of Present Illness: .   Tina Collins is a 73 y.o. Caucasian female patient with hypercholesterolemia but otherwise no other significant cardiovascular history, no history of hypertension, diabetes, presents for f/u of hyperlipidemia & dyspnea on exertion.   Discussed the use of AI scribe software for clinical note transcription with the patient, who gave verbal consent to proceed.  History of Present Illness Tina Collins "[name]" is a 73 year old female who presents with concerns about her cholesterol levels and shortness of breath. She is concerned about elevated lipoprotein A levels, seeking clarification on recent test results. Her family history includes vascular dementia, as her mother had the condition, which raises her concern about cholesterol management.  Her shortness of breath has improved and is less noticeable than before. It was initially a significant concern prompting medical attention. She remains active, participating in community work through her church and engaging in various fulfilling activities.  Labs   Lab Results  Component Value Date   CHOL 223 (H) 12/26/2023   HDL 90 12/26/2023   LDLCALC 125 (H) 12/26/2023   TRIG 47 12/26/2023   CHOLHDL 2.5 12/26/2023      Component Ref Range & Units (hover) 2 mo ago  Lipoprotein (a) 169.3 High      ROS  Review of Systems  Cardiovascular:  Positive for dyspnea on exertion (mild). Negative for chest pain and leg swelling.    Physical Exam:   VS:  BP 116/68 (BP Location: Left Arm, Patient Position: Sitting, Cuff Size: Normal)   Pulse 79   Resp 16   Ht 5\' 3"  (1.6 m)   Wt 112 lb 9.6 oz (51.1 kg)   SpO2 97%   BMI 19.95 kg/m    Wt Readings from Last 3 Encounters:  03/04/24 112 lb 9.6 oz (51.1 kg)  08/23/23 112 lb (50.8 kg)   01/10/23 112 lb (50.8 kg)    Physical Exam Studies Reviewed: Aaron Aas    Coronary calcium score 04/21/2020: Coronary calcium score of 0.  Ascending and descending aortic measurements are normal.  Exercise treadmill stress test 02/25/2023: Exercise treadmill stress test performed using Bruce protocol.  Patient walked for 8 min 30 sec, reached 10.1 METS, and 103% of age predicted maximum heart rate.  Exercise capacity was good.  No chest pain reported.  Normal heart rate and hemodynamic response. Stress EKG revealed no ischemic changes. Low risk study. EKG:    EKG Interpretation Date/Time:  Monday Mar 04 2024 10:09:51 EDT Ventricular Rate:  69 PR Interval:  140 QRS Duration:  78 QT Interval:  370 QTC Calculation: 396 R Axis:   68  Text Interpretation: EKG 03/04/2024: Normal sinus rhythm at rate of 69 bpm, incomplete right bundle branch block.  Normal EKG. Confirmed by Kearney Evitt, Jagadeesh ([id number]) on 03/04/2024 10:29:59 AM    Medications and allergies    No Known Allergies   Current Outpatient Medications:    calcium carbonate (OS-CAL) 1250 (500 Ca) MG chewable tablet, Chew 1 tablet by mouth daily., Disp: , Rfl:    Cholecalciferol (VITAMIN D3) 50 MCG (2000 UT) capsule, Take 2,000 Units by mouth daily., Disp: , Rfl:    LUTEIN PO, , Disp: , Rfl:    Multiple Vitamins-Minerals (PRESERVISION AREDS 2) CAPS, , Disp: , Rfl:    NON FORMULARY, Take  1 capsule by mouth daily. OsteoMATRIX, Disp: , Rfl:    NON FORMULARY, Take 1 tablet by mouth daily. MindWORKS, Disp: , Rfl:    No orders of the defined types were placed in this encounter.    Medications Discontinued During This Encounter  Medication Reason   Vitamin D, Ergocalciferol, (DRISDOL) 1.25 MG (50000 UNIT) CAPS capsule Dose change   Cholecalciferol 1.25 MG (50000 UT) capsule Duplicate     ASSESSMENT AND PLAN: .      ICD-10-CM   1. DOE (dyspnea on exertion)  R06.09 EKG 12-Lead    CT CARDIAC SCORING (SELF PAY ONLY)    2. Hypercholesteremia   E78.00 CT CARDIAC SCORING (SELF PAY ONLY)      Assessment and Plan Assessment & Plan Dyspnea on exertion   Her dyspnea on exertion has improved since the last visit. In 2021, a coronary calcium score was zero, indicating no significant plaque buildup. Last year, she completed a treadmill test, walking for nine minutes and thirty seconds without irregular heartbeats or EKG abnormalities, which is excellent for her age.  Mild hypercholesterolemia   She has mild hypercholesterolemia. A coronary calcium score of zero from four years ago indicates a low risk of coronary artery disease. Despite elevated cholesterol levels, the absence of other risk factors and a healthy lifestyle suggest a low immediate risk for cardiovascular events. Cholesterol accounts for only 50% of cardiovascular events, with the remainder unexplained. No treatment is recommended due to the absence of vascular disease and the low coronary calcium score. Order a coronary calcium score to assess for any changes since the last test.  Elevated lipoprotein(a)   Her lipoprotein(a) is elevated with a value over 150, while normal is 75. It is a recognized risk factor for cardiovascular events, but no current treatment is available. Future studies may provide treatment options. In the absence of known vascular disease and with a zero coronary calcium score, no immediate intervention is necessary. Order a coronary calcium score to assess for any changes since the last test.  Unless there is been a significant change in her coronary calcium score, do not think she needs any statin therapy or change in therapy.  I will see her back on a as needed basis.   Signed,  Knox Perl, MD, [facility] 03/04/2024, 12:02 PM [facility] [address] [place], [place] [address] Phone: [phone]. Fax:  [phone]

## 2024-03-04 NOTE — Patient Instructions (Signed)
 Medication Instructions:  Your physician recommends that you continue on your current medications as directed. Please refer to the Current Medication list given to you today.  *If you need a refill on your cardiac medications before your next appointment, please call your pharmacy*  Lab Work: none If you have labs (blood work) drawn today and your tests are completely normal, you will receive your results only by: MyChart Message (if you have MyChart) OR A paper copy in the mail If you have any lab test that is abnormal or we need to change your treatment, we will call you to review the results.  Testing/Procedures: Dr Berry Bristol recommends you have a calcium score CT scan  Follow-Up: At [facility], you and your health needs are our priority.  As part of our continuing mission to provide you with exceptional heart care, our providers are all part of one team.  This team includes your primary Cardiologist (physician) and Advanced Practice Providers or APPs (Physician Assistants and Nurse Practitioners) who all work together to provide you with the care you need, when you need it.  Your next appointment:   As needed  Provider:   Knox Perl, MD    We recommend signing up for the patient portal called "MyChart".  Sign up information is provided on this After Visit Summary.  MyChart is used to connect with patients for Virtual Visits (Telemedicine).  Patients are able to view lab/test results, encounter notes, upcoming appointments, etc.  Non-urgent messages can be sent to your provider as well.   To learn more about what you can do with MyChart, go to [email or web address].   Other Instructions

## 2024-03-14 ENCOUNTER — Ambulatory Visit: Payer: Self-pay | Admitting: Cardiology

## 2024-03-14 ENCOUNTER — Ambulatory Visit (HOSPITAL_BASED_OUTPATIENT_CLINIC_OR_DEPARTMENT_OTHER)
Admission: RE | Admit: 2024-03-14 | Discharge: 2024-03-14 | Disposition: A | Discharge status: Home / Self Care | Payer: Self-pay | Source: Ambulatory Visit | Attending: Cardiology | Admitting: Cardiology

## 2024-03-14 DIAGNOSIS — E7800 Pure hypercholesterolemia, unspecified: Secondary | ICD-10-CM | POA: Insufficient documentation

## 2024-03-14 DIAGNOSIS — R0609 Other forms of dyspnea: Secondary | ICD-10-CM | POA: Insufficient documentation

## 2024-03-14 NOTE — Progress Notes (Signed)
 Coronary calcium score is 0 and unchanged from prior coronary CT angiogram.  Please reassure patient.

## 2024-03-30 DIAGNOSIS — R0781 Pleurodynia: Secondary | ICD-10-CM | POA: Diagnosis not present

## 2024-03-30 DIAGNOSIS — M940 Chondrocostal junction syndrome [Tietze]: Secondary | ICD-10-CM | POA: Diagnosis not present

## 2024-04-17 NOTE — Progress Notes (Signed)
 Coronary calcium score of 0 and no other significant extracardiac abnormality noted. Low risk for cardiac and vascular events

## 2024-04-26 DIAGNOSIS — S91332A Puncture wound without foreign body, left foot, initial encounter: Secondary | ICD-10-CM | POA: Diagnosis not present

## 2024-06-17 ENCOUNTER — Other Ambulatory Visit: Payer: Self-pay | Admitting: Obstetrics and Gynecology

## 2024-06-17 DIAGNOSIS — Z1231 Encounter for screening mammogram for malignant neoplasm of breast: Secondary | ICD-10-CM

## 2024-07-03 DIAGNOSIS — H6123 Impacted cerumen, bilateral: Secondary | ICD-10-CM | POA: Diagnosis not present

## 2024-07-04 DIAGNOSIS — R079 Chest pain, unspecified: Secondary | ICD-10-CM | POA: Diagnosis not present

## 2024-07-04 DIAGNOSIS — Z7722 Contact with and (suspected) exposure to environmental tobacco smoke (acute) (chronic): Secondary | ICD-10-CM | POA: Diagnosis not present

## 2024-07-09 DIAGNOSIS — Z0000 Encounter for general adult medical examination without abnormal findings: Secondary | ICD-10-CM | POA: Diagnosis not present

## 2024-07-09 DIAGNOSIS — Z131 Encounter for screening for diabetes mellitus: Secondary | ICD-10-CM | POA: Diagnosis not present

## 2024-07-09 DIAGNOSIS — Z1331 Encounter for screening for depression: Secondary | ICD-10-CM | POA: Diagnosis not present

## 2024-07-13 DIAGNOSIS — Z23 Encounter for immunization: Secondary | ICD-10-CM | POA: Diagnosis not present

## 2024-07-16 DIAGNOSIS — L814 Other melanin hyperpigmentation: Secondary | ICD-10-CM | POA: Diagnosis not present

## 2024-07-16 DIAGNOSIS — L821 Other seborrheic keratosis: Secondary | ICD-10-CM | POA: Diagnosis not present

## 2024-07-16 DIAGNOSIS — D2261 Melanocytic nevi of right upper limb, including shoulder: Secondary | ICD-10-CM | POA: Diagnosis not present

## 2024-07-16 DIAGNOSIS — D1801 Hemangioma of skin and subcutaneous tissue: Secondary | ICD-10-CM | POA: Diagnosis not present

## 2024-07-16 DIAGNOSIS — D2271 Melanocytic nevi of right lower limb, including hip: Secondary | ICD-10-CM | POA: Diagnosis not present

## 2024-07-16 DIAGNOSIS — L84 Corns and callosities: Secondary | ICD-10-CM | POA: Diagnosis not present

## 2024-07-16 DIAGNOSIS — D225 Melanocytic nevi of trunk: Secondary | ICD-10-CM | POA: Diagnosis not present

## 2024-07-22 ENCOUNTER — Other Ambulatory Visit: Payer: Self-pay | Admitting: Family Medicine

## 2024-07-22 DIAGNOSIS — Z7722 Contact with and (suspected) exposure to environmental tobacco smoke (acute) (chronic): Secondary | ICD-10-CM

## 2024-08-12 ENCOUNTER — Ambulatory Visit

## 2024-08-12 ENCOUNTER — Ambulatory Visit
Admission: RE | Admit: 2024-08-12 | Discharge: 2024-08-12 | Disposition: A | Source: Ambulatory Visit | Attending: Obstetrics and Gynecology | Admitting: Obstetrics and Gynecology

## 2024-08-12 DIAGNOSIS — Z1231 Encounter for screening mammogram for malignant neoplasm of breast: Secondary | ICD-10-CM

## 2024-08-13 ENCOUNTER — Other Ambulatory Visit

## 2024-08-15 ENCOUNTER — Ambulatory Visit
Admission: RE | Admit: 2024-08-15 | Discharge: 2024-08-15 | Disposition: A | Source: Ambulatory Visit | Attending: Family Medicine | Admitting: Family Medicine

## 2024-08-15 DIAGNOSIS — I700 Atherosclerosis of aorta: Secondary | ICD-10-CM | POA: Diagnosis not present

## 2024-08-15 DIAGNOSIS — Z7722 Contact with and (suspected) exposure to environmental tobacco smoke (acute) (chronic): Secondary | ICD-10-CM

## 2024-08-15 DIAGNOSIS — I2510 Atherosclerotic heart disease of native coronary artery without angina pectoris: Secondary | ICD-10-CM | POA: Diagnosis not present

## 2024-08-16 DIAGNOSIS — M25561 Pain in right knee: Secondary | ICD-10-CM | POA: Diagnosis not present

## 2024-08-20 DIAGNOSIS — Z01419 Encounter for gynecological examination (general) (routine) without abnormal findings: Secondary | ICD-10-CM | POA: Diagnosis not present

## 2024-08-21 DIAGNOSIS — M810 Age-related osteoporosis without current pathological fracture: Secondary | ICD-10-CM | POA: Diagnosis not present

## 2024-08-24 DIAGNOSIS — Z23 Encounter for immunization: Secondary | ICD-10-CM | POA: Diagnosis not present

## 2024-08-30 DIAGNOSIS — M25561 Pain in right knee: Secondary | ICD-10-CM | POA: Diagnosis not present

## 2024-09-05 DIAGNOSIS — S83281D Other tear of lateral meniscus, current injury, right knee, subsequent encounter: Secondary | ICD-10-CM | POA: Diagnosis not present

## 2024-09-05 DIAGNOSIS — M7121 Synovial cyst of popliteal space [Baker], right knee: Secondary | ICD-10-CM | POA: Diagnosis not present

## 2024-10-02 DIAGNOSIS — H353131 Nonexudative age-related macular degeneration, bilateral, early dry stage: Secondary | ICD-10-CM | POA: Diagnosis not present

## 2024-10-02 DIAGNOSIS — H40013 Open angle with borderline findings, low risk, bilateral: Secondary | ICD-10-CM | POA: Diagnosis not present

## 2024-10-02 DIAGNOSIS — H25813 Combined forms of age-related cataract, bilateral: Secondary | ICD-10-CM | POA: Diagnosis not present

## 2024-10-03 DIAGNOSIS — S83281D Other tear of lateral meniscus, current injury, right knee, subsequent encounter: Secondary | ICD-10-CM | POA: Diagnosis not present
